# Patient Record
Sex: Female | Born: 1975 | Race: Black or African American | Hispanic: No | Marital: Married | Smoking: Never smoker
Health system: Southern US, Community
[De-identification: ages and names within clinical notes are randomized; demographics above are authoritative.]

---

## 2001-06-07 ENCOUNTER — Emergency Department (HOSPITAL_COMMUNITY): Admission: EM | Admit: 2001-06-07 | Discharge: 2001-06-07 | Payer: Self-pay | Admitting: Emergency Medicine

## 2002-10-07 ENCOUNTER — Other Ambulatory Visit: Admission: RE | Admit: 2002-10-07 | Discharge: 2002-10-07 | Payer: Self-pay | Admitting: Obstetrics and Gynecology

## 2003-04-02 ENCOUNTER — Inpatient Hospital Stay (HOSPITAL_COMMUNITY): Admission: AD | Admit: 2003-04-02 | Discharge: 2003-04-04 | Payer: Self-pay | Admitting: Obstetrics and Gynecology

## 2003-05-14 ENCOUNTER — Other Ambulatory Visit: Admission: RE | Admit: 2003-05-14 | Discharge: 2003-05-14 | Payer: Self-pay | Admitting: Obstetrics and Gynecology

## 2004-03-09 ENCOUNTER — Other Ambulatory Visit: Admission: RE | Admit: 2004-03-09 | Discharge: 2004-03-09 | Payer: Self-pay | Admitting: Obstetrics and Gynecology

## 2004-09-09 ENCOUNTER — Inpatient Hospital Stay (HOSPITAL_COMMUNITY): Admission: AD | Admit: 2004-09-09 | Discharge: 2004-09-11 | Payer: Self-pay | Admitting: Obstetrics and Gynecology

## 2004-09-15 ENCOUNTER — Emergency Department (HOSPITAL_COMMUNITY): Admission: EM | Admit: 2004-09-15 | Discharge: 2004-09-15 | Payer: Self-pay | Admitting: Family Medicine

## 2004-10-14 ENCOUNTER — Other Ambulatory Visit: Admission: RE | Admit: 2004-10-14 | Discharge: 2004-10-14 | Payer: Self-pay | Admitting: Obstetrics and Gynecology

## 2004-12-17 ENCOUNTER — Ambulatory Visit (HOSPITAL_COMMUNITY): Admission: RE | Admit: 2004-12-17 | Discharge: 2004-12-17 | Payer: Self-pay | Admitting: Obstetrics and Gynecology

## 2005-06-08 ENCOUNTER — Ambulatory Visit (HOSPITAL_COMMUNITY): Admission: RE | Admit: 2005-06-08 | Discharge: 2005-06-08 | Payer: Self-pay | Admitting: Obstetrics and Gynecology

## 2005-08-30 ENCOUNTER — Emergency Department (HOSPITAL_COMMUNITY): Admission: EM | Admit: 2005-08-30 | Discharge: 2005-08-30 | Payer: Self-pay | Admitting: Family Medicine

## 2007-01-15 ENCOUNTER — Emergency Department (HOSPITAL_COMMUNITY): Admission: EM | Admit: 2007-01-15 | Discharge: 2007-01-15 | Payer: Self-pay | Admitting: Emergency Medicine

## 2007-03-20 ENCOUNTER — Ambulatory Visit (HOSPITAL_COMMUNITY): Admission: RE | Admit: 2007-03-20 | Discharge: 2007-03-20 | Payer: Self-pay | Admitting: General Surgery

## 2007-03-20 ENCOUNTER — Encounter (INDEPENDENT_AMBULATORY_CARE_PROVIDER_SITE_OTHER): Payer: Self-pay | Admitting: General Surgery

## 2008-07-13 ENCOUNTER — Emergency Department (HOSPITAL_COMMUNITY): Admission: EM | Admit: 2008-07-13 | Discharge: 2008-07-13 | Payer: Self-pay | Admitting: Family Medicine

## 2008-11-23 IMAGING — RF DG CHOLANGIOGRAM OPERATIVE
1 series · 4 of 4 positions shown · non-contrast
Comparison: none

CLINICAL DATA: Gallstones.  
 INTRAOPERATIVE CHOLANGIOGRAM:
TECHNIQUE: Multiple fluoroscopic spot radiographs were obtained during intraoperative cholangiogram, and are submitted for interpretation post-operatively.

[Series 1: run · 4 of 67 frames shown]
[frame 5/67]
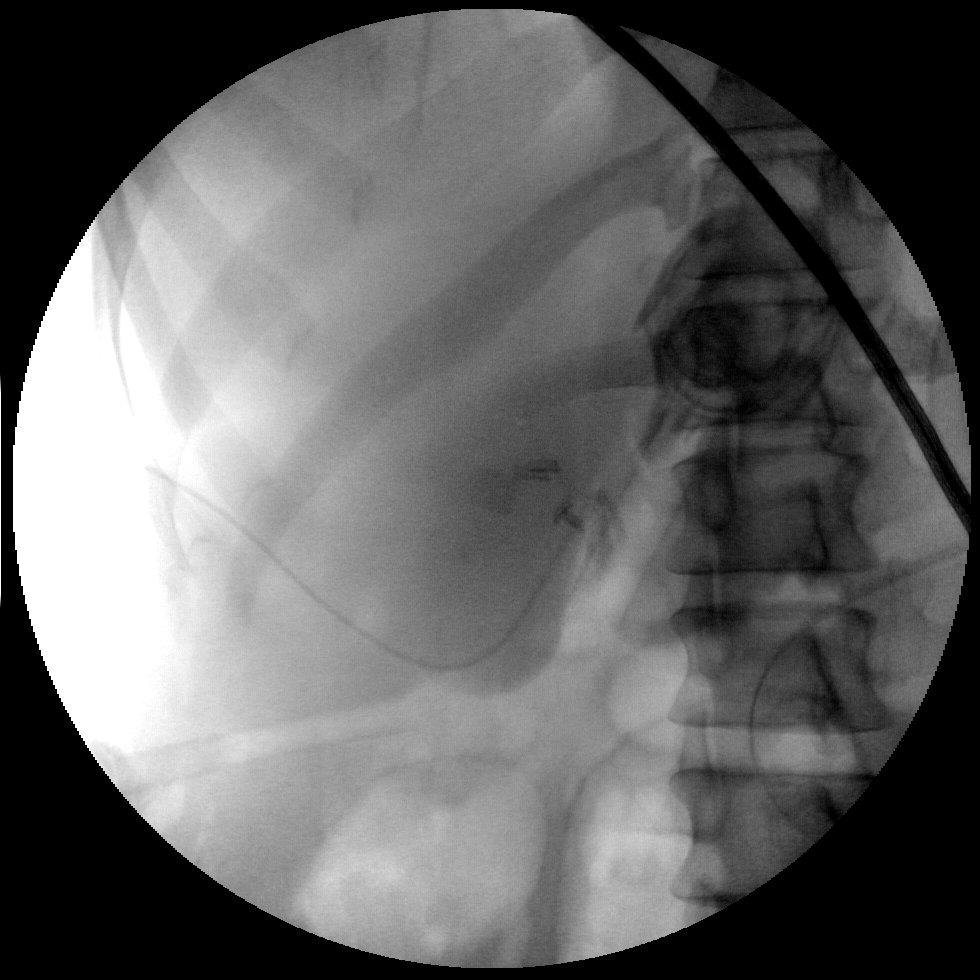
[frame 11/67]
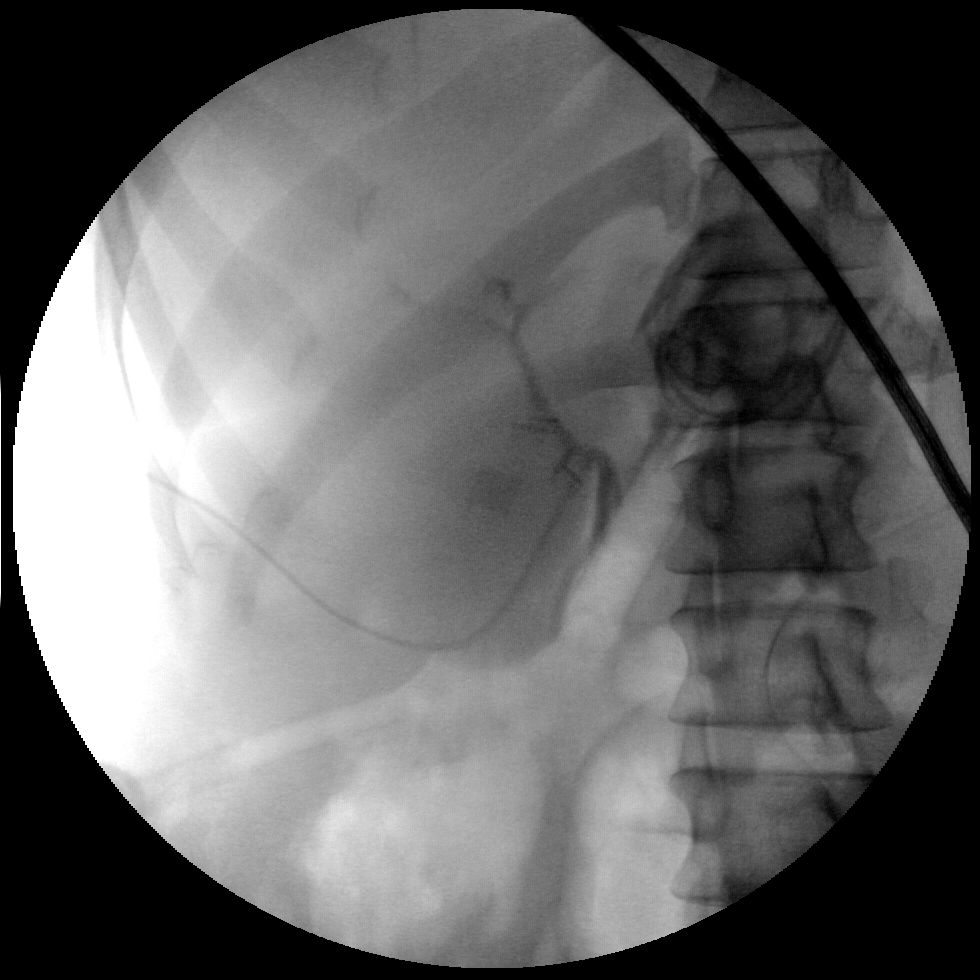
[frame 34/67]
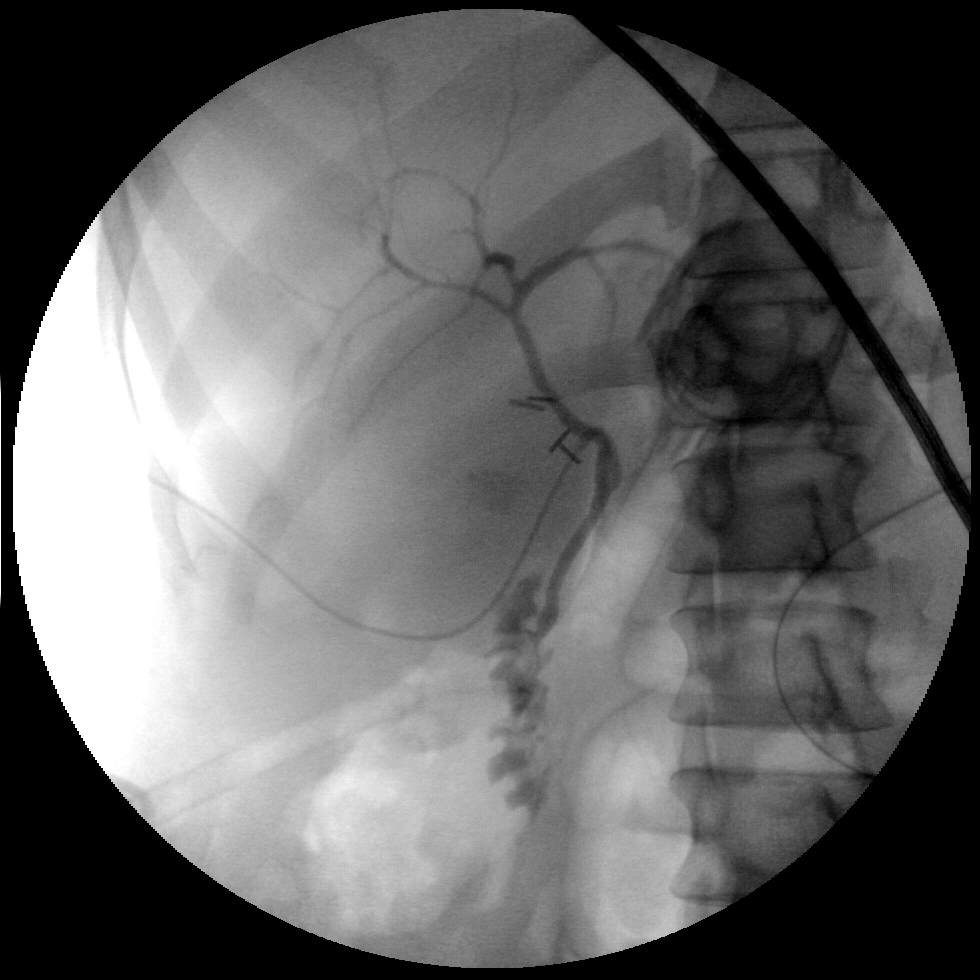
[frame 57/67]
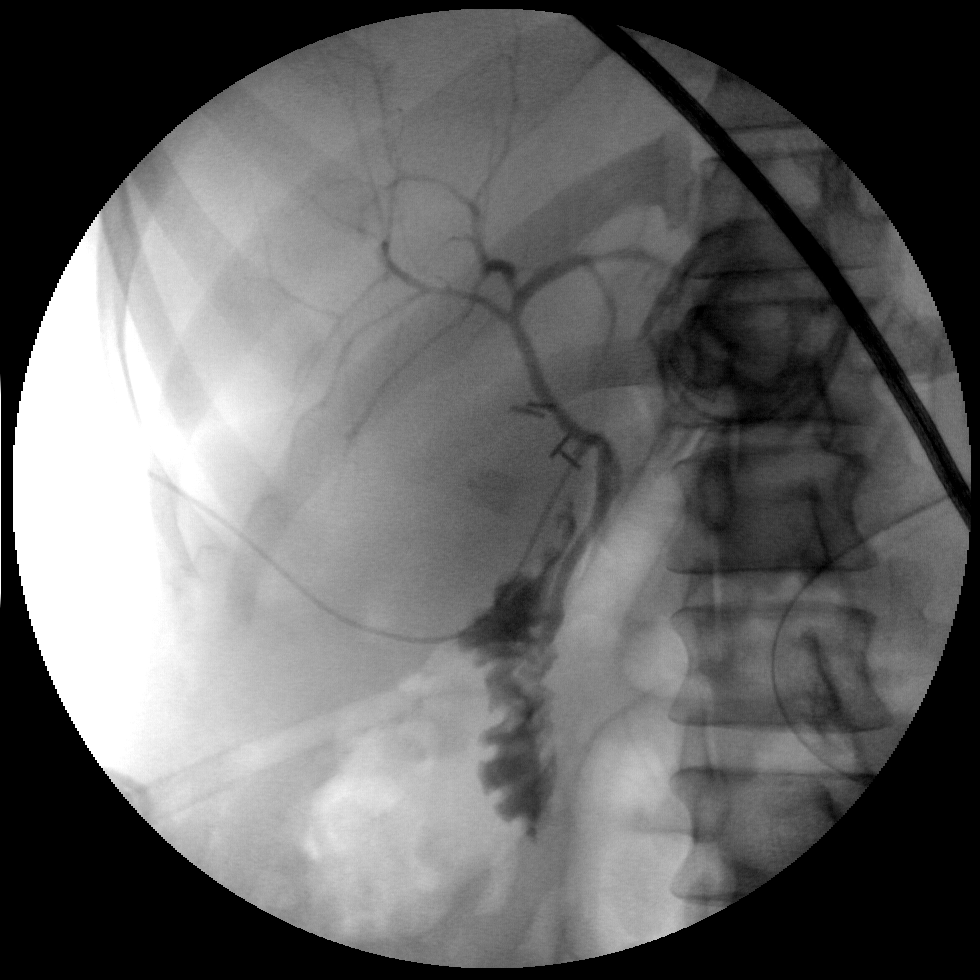

[4 of 4 positions shown; findings below may reference images not displayed]

FINDINGS: Contrast was injected via the cystic duct.  There was good visualization of the common bile duct which is normal in caliber.  No retained calculus is seen, and there is free passage of contrast into the duodenal loop.
IMPRESSION: Negative intraoperative cholangiogram.

## 2010-06-22 LAB — URINE CULTURE

## 2010-06-22 LAB — POCT URINALYSIS DIP (DEVICE)
Nitrite: NEGATIVE
Protein, ur: 30 mg/dL — AB
Specific Gravity, Urine: >=1.03 (ref 1.005–1.030)
Urobilinogen, UA: 0.2 mg/dL (ref 0.0–1.0)
pH: 5.5 (ref 5.0–8.0)

## 2010-07-27 NOTE — Op Note (Signed)
NAMEROLINDA, IMPSON              ACCOUNT NO.:  [id number]   MEDICAL RECORD NO.:  [id number]          PATIENT TYPE:  AMB   LOCATION:  DAY                          FACILITY:  [facility]   PHYSICIAN:  Anselm Pancoast. Weatherly, M.D.DATE OF BIRTH:  [birth year]   DATE OF PROCEDURE:  03/20/2007  DATE OF DISCHARGE:                               OPERATIVE REPORT   PREOPERATIVE DIAGNOSIS:  Chronic cholecystitis with stones.   POSTOPERATIVE DIAGNOSIS:  Chronic cholecystitis with stones.   OPERATION:  Laparoscopic cholecystectomy with cholangiogram.   ANESTHESIA:  General anesthesia.   SURGEON:  Anselm Pancoast. Zachery Dakins, M.D.   ASSISTANT:  Sandria Bales. Ezzard Standing, M.D.   HISTORY:  Ms. Kindig is a 35 year old black female who has a history of  multiple gallstones.  She has three children.  I saw her in the office  in late November.  She had had recurrent episodes of epigastric pain and  normal liver function studies and it appears that she been having stones  for about a year and was tentatively scheduled previously for surgery,  but with three children things got postponed and she started to have  recurrent episodes of pain and I saw her in late November.  She was  scheduled for surgery at this time.  She said she has not actually had  bad episodes of pain over the last several weeks.   PROCEDURE:  Preoperatively her CBC and CMET were all normal.  Preop, she  was given 3 grams of Unasyn, she has PAS stockings and was taken to the  operative suite.   After induction of general anesthesia, endotracheal tube, __________  to  the stomach and then the abdomen was prepped with Betadine solution and  draped in sterile manner.  A small incision was made below the  umbilicus, very carefully entered into the peritoneal cavity.  Pursestring suture of #0 Vicryl was placed and the Hasson cannula  introduced.  The gallbladder was contracted, you could see numerous  stones within it through the thin gallbladder wall and  the upper 10 mL  trocar was placed in the  subxiphoid area and two lateral 5-mm trocars  were placed.  The gallbladder was retracted upward and outward by Dr.  Ezzard Standing and we very carefully opened the peritoneum over the proximal  portion of the gallbladder.  She has a short cystic duct and you could  see a stone impacted in the cystic duct just below its junction with the  gallbladder.  I was able to encompass this with a right-angle carefully  and then tried to push the stone back up into the gallbladder body  proper, but it really was fairly adherent and  could not do this.  The  little anterior branch of the cystic artery had been frayed, doubly  clipped proximally, singly distally, and divided and then we made a  little opening, slipped in a Cholangiocath and did an x-ray that showed  that we had about a centimeter cystic duct, good flow into the common  bile duct and duodenum and the intrahepatic radicals filled nicely.  The  cystic duct was triply  clipped, after removing the catheter, and I  divided the cystic duct just distal to the clips and then very carefully  dissected out and we were probably dissecting where the branches of the  cystic artery on the proximal portion of gallbladder.  These were  carefully separated from the gallbladder wall.  We could see the common  hepatic duct very closely adherent in this area, but under direct  vision, separated and  clipped the little branches of the cystic artery  and then freed the gallbladder.  The gallbladder bed was lightly  coagulated in any areas was of bleeding.  Gallbladder placed into an  EndoCatch bag and brought out through the umbilicus.  She had numerous  stones, some quite small, some a little larger and they were placed in  the little container and sent to pathology.  The gallbladder fossa and  bed were carefully inspected,  no evidence of bleeding.  The clips were  all in good position and then the little irrigating fluid  was aspirated.  The 5-mm ports were withdrawn under direct vision, I put an additional  figure-of-eight of #0 Vicryl in the fascia at the umbilicus, tied both  in the pursestring.  The upper 10-mm trocar was drawn, the subcutaneous  sutures of 4-0 Vicryl were placed, Benzoin and Steri-Strips on the skin.  The patient tolerated procedure nicely and was she was sent to the  recovery room in stable postoperative condition.   I think that she can be released today if she desires, otherwise will  keep her overnight and release her in the morning.           ______________________________  Anselm Pancoast. Zachery Dakins, M.D.     WJW/MEDQ  D:  03/20/2007  T:  03/20/2007  Job:  [id number]   cc:   Gabriel Earing, M.D.  Fax: [phone]

## 2010-07-30 NOTE — H&P (Signed)
Teresa Webster, EDGAR              ACCOUNT NO.:  [id number]   MEDICAL RECORD NO.:  [id number]           PATIENT TYPE:   LOCATION:                                 FACILITY:   PHYSICIAN:  Juluis Mire, M.D.        DATE OF BIRTH:   DATE OF ADMISSION:  12/17/2004  DATE OF DISCHARGE:                                HISTORY & PHYSICAL   HISTORY OF PRESENT ILLNESS:  The patient is a 35 year old, gravida 4, para  3, abortus 1 female who presents for Essure tubal ligation.  The patient is  desirous of permanent sterilization.  The potential irreversibility of  sterilization was discussed.  A failure rate of 1 in 200 is explained.  Failures can be in the form of ectopic pregnancy requiring further surgical  management.  Alternatives for birth control have also been explained.  She  does present for Essure tubal ligation.   ALLERGIES:  No known drug allergies.   MEDICATIONS:  Micronor.   PAST MEDICAL HISTORY:  Usual childhood diseases without any significant  sequelae.   PAST SURGICAL HISTORY:  1.  She had a wisdom tooth extracted.  2.  A D&C for an abortion.   OBSTETRICAL HISTORY:  Besides the previous-noted abortion, she has had 3  vaginal deliveries.   FAMILY HISTORY:  Noncontributory.   SOCIAL HISTORY:  No tobacco or alcohol use.   REVIEW OF SYSTEMS:  Noncontributory.   PHYSICAL EXAMINATION:  GENERAL:  The patient is afebrile.  VITAL SIGNS:  Stable.  HEENT:  The patient is normocephalic.  Pupils equal, round and reactive to  light and accommodation.  Extraocular movements were intact.  Sclerae and  conjunctivae were clear.  Oropharynx clear.  NECK:  Without thyromegaly.  BREASTS:  No discrete masses.  LUNGS:  Clear.  CARDIAC:  Regular rhythm and rate.  No murmurs or gallops.  ABDOMEN:  Benign.  No mass, organomegaly, or tenderness.  PELVIC:  Normal external genitalia.  Vaginal mucosa clear.  Cervix  unremarkable.  Uterus normal size, shape, and contour.  Adnexa free of  masses or tenderness.  EXTREMITIES:  Trace edema.  NEUROLOGIC:  Grossly within normal limits.   BASIC IMPRESSION:  Multiparity, desires sterility.   PLAN:  The patient will under a hysteroscopic Essure tubal ligation.  We  have discussed again the potential irreversibility, as well as failure  rates.  Alternatives for birth control explained.  Discussed the risk of the  procedure including anesthetic concerns.  The risk of inciting infection.  Of note, GC and Chlamydia during the pregnancy were negative.  We have  discussed the risk of perforation of tube or uterus with associated bleeding  that could require transfusion, possible exploratory surgery for management.  The risk of injury to adjacent organs requiring exploratory surgery.  The  risk of deep vein thrombosis and pulmonary embolus.  If unable to do the  Essure properly, will proceed with laparoscopic bilateral tubal ligation.  Its risks have also been explained, as noted above.      Juluis Mire, M.D.  Electronically Signed  JSM/MEDQ  D:  12/17/2004  T:  12/17/2004  Job:  [id number]

## 2010-07-30 NOTE — Op Note (Signed)
NAMEFELIZA, Teresa Webster              ACCOUNT NO.:  [id number]   MEDICAL RECORD NO.:  [id number]          PATIENT TYPE:  AMB   LOCATION:  SDC                           FACILITY:  [facility]   PHYSICIAN:  Juluis Mire, M.D.   DATE OF BIRTH:  Feb 20, 1976   DATE OF PROCEDURE:  12/17/2004  DATE OF DISCHARGE:                                 OPERATIVE REPORT   PREOPERATIVE DIAGNOSIS:  Multiparity, desires sterility.   POSTOPERATIVE DIAGNOSIS:  Multiparity, desires sterility.   OPERATIVE PROCEDURE:  Hysteroscopy and placement of the Essure.   SURGEON:  Juluis Mire, M.D.   ANESTHESIA:  General.   ESTIMATED BLOOD LOSS:  Minimal.   PACKS AND DRAINS:  None.   INTRAOPERATIVE BLOOD REPLACED:  None.   COMPLICATIONS:  None.   INDICATIONS:  Dictated in the history and physical.   PROCEDURE:  The patient was taken to the OR and placed in the supine  position.  After a satisfactory level of general anesthesia was obtained,  the patient was placed in the dorsal lithotomy position using the Allen  stirrups.  The abdomen, perineum and vagina were prepped out with Betadine  and draped in a sterile field.  A speculum was placed in the vaginal vault.  The cervix was grasped with a single-tooth tenaculum.  The uterus sounded  approximately 8 cm.  The cervix was dilated to a size 25 Pratt dilator.  The  diagnostic hysteroscope was introduced and the intrauterine cavity was  distended using sorbitol.  Visualization revealed a normal endometrial  cavity.  We could visualize both tubal ostia without difficulty.  We first  went to the right fallopian tube.  The Essure was brought into place and  properly deployed.  After removal, we had approximately four coils exposed  in the intrauterine cavity on the right side.  We then went to the left  side, again identified the tubal ostium.  We ensured the Essure and deployed  it properly.  There were approximately four coils also on the left side.  At  the end of  the procedure, there  was no sign of perforation.  Total deficit was 300 mL.  The speculum and  single-tooth tenaculum were removed.  The patient was taken out of the  dorsal lithotomy position, once alert and extubated transferred to the  recovery room in good condition.  Sponge, instrument and needle count  reported as correct by the circulating nurse x2.      Juluis Mire, M.D.  Electronically Signed     JSM/MEDQ  D:  12/17/2004  T:  12/17/2004  Job:  [id number]

## 2010-12-01 LAB — CBC
Hemoglobin: 12.3
MCHC: 34.2
Platelets: 437 — ABNORMAL HIGH
RDW: 12.8
WBC: 7.5

## 2010-12-01 LAB — COMPREHENSIVE METABOLIC PANEL
Albumin: 3.9
BUN: 18
CO2: 28
Chloride: 110
GFR calc non Af Amer: >60
Potassium: 4.3
Sodium: 141
Total Bilirubin: 0.5
Total Protein: 7

## 2010-12-01 LAB — DIFFERENTIAL
Eosinophils Absolute: 0.2
Lymphocytes Relative: 46
Lymphs Abs: 3.5
Neutro Abs: 3.2
Neutrophils Relative %: 43

## 2016-04-09 DIAGNOSIS — M940 Chondrocostal junction syndrome [Tietze]: Secondary | ICD-10-CM | POA: Diagnosis not present

## 2016-04-14 DIAGNOSIS — Z713 Dietary counseling and surveillance: Secondary | ICD-10-CM | POA: Diagnosis not present

## 2016-04-19 DIAGNOSIS — Z01419 Encounter for gynecological examination (general) (routine) without abnormal findings: Secondary | ICD-10-CM | POA: Diagnosis not present

## 2017-01-04 DIAGNOSIS — D473 Essential (hemorrhagic) thrombocythemia: Secondary | ICD-10-CM | POA: Diagnosis not present

## 2017-01-04 DIAGNOSIS — Z23 Encounter for immunization: Secondary | ICD-10-CM | POA: Diagnosis not present

## 2017-02-23 ENCOUNTER — Ambulatory Visit (HOSPITAL_COMMUNITY)
Admission: EM | Admit: 2017-02-23 | Discharge: 2017-02-23 | Disposition: A | Discharge status: Home / Self Care | Payer: 59 | Attending: Internal Medicine | Admitting: Internal Medicine

## 2017-02-23 ENCOUNTER — Other Ambulatory Visit: Payer: Self-pay

## 2017-02-23 ENCOUNTER — Encounter (HOSPITAL_COMMUNITY): Payer: Self-pay | Admitting: Emergency Medicine

## 2017-02-23 DIAGNOSIS — M79671 Pain in right foot: Secondary | ICD-10-CM

## 2017-02-23 NOTE — Discharge Instructions (Signed)
Pain may be related to an over use injury/ muscular strain of the foot.   Please use anti-inflammatories for pain relief- may use ibuprofen or aleeve, may supplement with Tylenol. May also use ice multiple times a day.   Please rest and weight bear as tolerated. Avoid activity for 1 week and monitor improvement of symptoms. If symptoms are not improving in 1-2 weeks please return.

## 2017-02-23 NOTE — ED Triage Notes (Signed)
Drives fork lifts and motorized equipment.  Used a different piece of equipment requiring pressing a peddle with the right foot, constantly.  Near end of shift started having pain in right foot.  no particular injury.  Pain in arch above and below.  Pain is constant, worse with bearing weight

## 2017-02-23 NOTE — ED Provider Notes (Signed)
[place]    CSN: [id number] Arrival date & time: 02/23/17  1002     History   Chief Complaint Chief Complaint  Patient presents with  . Foot Pain    HPI Teresa Webster is a 41 y.o. female presenting with right foot pain for 1 day. 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 She is standing most of the shift on the forklift. She noticed towards the end of the shift she started to have a throbbing pain in her right foot. No injury, twisting, or dropping anything on foot. Wears work boots that are not new. States increased pain with weightbearing, pain is not relieving with rest/non-weightbearing. Pain does not radiate into calf or knee   HPI  History reviewed. No pertinent past medical history.  There are no active problems to display for this patient.   History reviewed. No pertinent surgical history.  OB History    No data available       Home Medications    Prior to Admission medications   Not on File    Family History Family History  Problem Relation Age of Onset  . Healthy Mother     Social History Social History   Tobacco Use  . Smoking status: Never Smoker  Substance Use Topics  . Alcohol use: Yes  . Drug use: No     Allergies   Patient has no known allergies.   Review of Systems Review of Systems  Constitutional: Negative for fatigue and fever.  Respiratory: Negative for shortness of breath.   Cardiovascular: Negative for chest pain.  Gastrointestinal: Negative for nausea and vomiting.  Musculoskeletal: Negative for gait problem and joint swelling.       Foot pain  Skin: Negative for color change, pallor and wound.  Neurological: Negative for weakness and numbness.     Physical Exam Triage Vital Signs ED Triage Vitals  Enc Vitals Group     BP 02/23/17 1019 (!) 147/89     Pulse Rate 02/23/17 1019 (!) 104     Resp 02/23/17 1019 18   Temp 02/23/17 1019 98.4 F (36.9 C)     Temp Source 02/23/17 1019 Oral     SpO2 02/23/17 1019 98 %     Weight --      Height --      Head Circumference --      Peak Flow --      Pain Score 02/23/17 1015 7     Pain Loc --      Pain Edu? --      Excl. in [place]? --    No data found.  Updated Vital Signs BP (!) 147/89 (BP Location: Left Arm)   Pulse (!) 104   Temp 98.4 F (36.9 C) (Oral)   Resp 18   LMP 02/23/2017   SpO2 98%   Physical Exam  Constitutional: She appears well-developed and well-nourished.  HENT:  Head: Normocephalic and atraumatic.  Eyes: EOM are normal.  Neck: Normal range of motion. Neck supple.  Cardiovascular: Normal rate and regular rhythm.  Pulmonary/Chest: Effort normal. No respiratory distress.  Abdominal: Soft. There is no tenderness.  Musculoskeletal:  Right foot: no swelling or erythema, mild tenderness to palpation of tarsals, no tenderness to palpation of lateral/medial malleolus, no tenderness over metatarsals/phalanges. Able to move toes without pain. Full active ROM of plantar flexion and dorsiflexion, pain with dorsiflexion. Dorsalis  Pedis 2+, Cap refill < 2 sec     UC Treatments / Results  Labs (all labs ordered are listed, but only abnormal results are displayed) Labs Reviewed - No data to display  EKG  EKG Interpretation None       Radiology No results found.  Procedures Procedures (including critical care time)  Medications Ordered in UC Medications - No data to display   Initial Impression / Assessment and Plan / UC Course  I have reviewed the triage vital signs and the nursing notes.  Pertinent labs & imaging results that were available during my care of the patient were reviewed by me and considered in my medical decision making (see chart for details).    Pain may be related to a muscular strain vs. Over use injury vs. Tendonitis. Unlikely a fracture/dislocation- Xray deferred. Treated with anti-inflammatories, rest,  ice, compression, elevation. Advised weight bear as tolerated, symptoms should improve in 1-2 weeks with rest and activity modification. 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 Advised she may try to use inserts to help with support in her work boots. Discussed return precautions to include pain not improving over 1-2 weeks, increased pain, weakness, numbness/tingling. Patient verbalized understanding and is agreeable with plan.   Final Clinical Impressions(s) / UC Diagnoses   Final diagnoses:  Foot pain, right    ED Discharge Orders    None       Controlled Substance Prescriptions [place] Controlled Substance Registry consulted? Not Applicable   Janith Lima, [place] 02/23/17 1335

## 2018-01-09 ENCOUNTER — Telehealth: Payer: Self-pay | Admitting: Hematology and Oncology

## 2018-01-09 ENCOUNTER — Encounter: Payer: Self-pay | Admitting: Hematology and Oncology

## 2018-01-09 NOTE — Telephone Encounter (Signed)
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 Pt returned my call to schedule an appt. Pt has been scheduled to see Dr. Audelia Hives on 11/22 at 1pm. Pt aware to arrive 30 minutes early. Letter mailed.

## 2018-02-02 ENCOUNTER — Other Ambulatory Visit: Payer: Self-pay | Admitting: Hematology and Oncology

## 2018-02-02 ENCOUNTER — Inpatient Hospital Stay: Payer: No Typology Code available for payment source

## 2018-02-02 ENCOUNTER — Telehealth: Payer: Self-pay

## 2018-02-02 ENCOUNTER — Inpatient Hospital Stay
Payer: No Typology Code available for payment source | Attending: Hematology and Oncology | Admitting: Hematology and Oncology

## 2018-02-02 VITALS — BP 151/104 | HR 96 | Temp 98.7°F | Resp 18 | Ht 62.0 in | Wt 201.3 lb

## 2018-02-02 DIAGNOSIS — R5383 Other fatigue: Secondary | ICD-10-CM | POA: Insufficient documentation

## 2018-02-02 DIAGNOSIS — D473 Essential (hemorrhagic) thrombocythemia: Secondary | ICD-10-CM

## 2018-02-02 DIAGNOSIS — M1990 Unspecified osteoarthritis, unspecified site: Secondary | ICD-10-CM | POA: Insufficient documentation

## 2018-02-02 DIAGNOSIS — D75839 Thrombocytosis, unspecified: Secondary | ICD-10-CM

## 2018-02-02 DIAGNOSIS — E559 Vitamin D deficiency, unspecified: Secondary | ICD-10-CM | POA: Diagnosis not present

## 2018-02-02 DIAGNOSIS — R7989 Other specified abnormal findings of blood chemistry: Secondary | ICD-10-CM

## 2018-02-02 DIAGNOSIS — E785 Hyperlipidemia, unspecified: Secondary | ICD-10-CM

## 2018-02-02 NOTE — Patient Instructions (Signed)
We discussed in detail the results of your prior laboratory studies which suggest normal white blood and red blood cells.  Your platelet count is only mildly elevated.  Your platelets have been very mildly elevated for many years.  Copy of your blood work was given for your review.  Laboratory studies will be done today to distinguish between a primary bone marrow overproduction versus a reactive process.  You need to be very careful about donating blood while your Ste[place] in menstruating.  This will be discussed in more detail at the time of your next visit.  Barring any unforeseen complications, your next scheduled doctor visit to discuss these results is on February 23, 2018.  Please do not hesitate to call should any new or untoward problems arise in the interim.  Thank you! Happy Thanksgiving Teresa Ridgel, MD Hematology/Oncology

## 2018-02-02 NOTE — Telephone Encounter (Signed)
Printed avs and calender of upcoming appointment. Per 11/22 los

## 2018-02-02 NOTE — Progress Notes (Signed)
Outpatient Hematology/Oncology Initial Consultation  Patient Name:  Teresa Webster  DOB: [birth year]   Date of Service: February 02, 2018  Referring Provider: Maude Leriche, PA-C [place], [place] [address]   Consulting Physician: Teresa Leber, MD Hematology/Oncology she is alone at this first visit.  Reason for Referral: In the setting of mild thrombocytosis without obvious explanation, she presents now for further diagnostic and therapeutic recommendations.  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  She is a gravida 5 para 3 abortion 2.  Her menses are generally every 28 days and fairly light.  Her primary care provider is Teresa Webster.  She is followed also by Teresa Webster, physician assistant.  Over the past 12 months, she has gained 45 pounds.  She tried to lose weight by increasing her water intake and increasing fruits and vegetables.  She complains of daytime fatigue although she sleeps 6-7 hours nightly without difficulty.  She is alone at this first visit.  She was aware that her platelet count was elevated for nearly the past 12 months.  A review of her distant laboratory studies from March 20, 2007 a complete blood count with hemoglobin 12.3 hematocrit 35.9 WBC 7.5; platelets 437,000.  More recently, on December 30, 2017: A repeat complete blood count showed hemoglobin 12.0 hematocrit 36.6 MCV 83 MCH 27.2 RDW 13.8 WBC 6.9 with 48% neutrophils 42% lymphocytes 8% monocytes 2% eosinophils; platelets 512,000.  TSH 1.67 vitamin D 11.6.  A comprehensive metabolic panel shows sodium 139 potassium 4.2 chloride 102 CO2 21 glucose 80 BUN 10 creatinine 0.7 calcium 9.8 total protein 7.3 albumin 4.3 total bilirubin 0.3 alkaline phosphatase 126 SGOT 14 SGPT  12.  She has no coronary artery disease, angina, or myocardial infarction.  She denies diabetes mellitus, or cardiac dysrhythmia.  Her blood pressure has been consistently elevated without a diagnosis of primary hypertension.  She has no stroke syndrome or seizure disorder.  She denies gastroesophageal reflux or peptic ulcer disease.  She has no viral hepatitis, inflammatory bowel disease, or symptomatic diverticulosis.  She has no kidney or renal failure.  She has no rheumatoid or gouty arthritis.  She has degenerative joint disease involving the fingers described as intermittent stiffness and pain.  She has no prior blood disorder or bleeding tendency.  In her family there is no blood disorder.  She reports no peripheral arterial venous thromboembolic disease.  While her appetite remains stable, she is gained 45 pounds in the past 12 months. She has no unusual headaches, dizziness, lightheadedness, syncope, or near syncopal episodes. She reports no visual changes or hearing deficit.  She has no rash or itching.  There is no unusual cough, sore throat, orthopnea. She has no dyspnea either at rest or on exertion.  No fever, shaking chills, sweats, or flulike symptoms are evident. She has no heartburn or indigestion.  She denies nausea, vomiting, diarrhea, or constipation. She has no melena or bright red blood per rectum.  No urinary frequency, urgency, hematuria, dysuria are evident. She has no new arthralgias or myalgias.  She denies bleeding tendency or easy bruisability.  She denies numbness or tingling in the fingers or toes.  It is with this background she presents now for further diagnostic and therapeutic recommendations in the setting of thrombocytosis as outlined above.  Past Medical History: Dyslipidemia Vitamin  D deficiency Degenerative joint disease involving the fingers bilaterally  Surgical History: 2018, uterine ablation Cholecystectomy 2010 Tubal ligation 2008  Gynecologic  History: Her menarche was at age 71 years Her last menses was 1 week earlier She is a gravida 5 para 3 abortion 2 Her first pregnancy was at age 38 years Her last pregnancy was at age 78 years He is generally light lasting 3-5 days She was on oral contraception for 8 years starting in her teens Her last screening mammogram was in 2019.  Her last Pap smear was reportedly normal in 2019 She has never had a breast biopsy  Family History  Problem Relation Age of Onset  . Healthy Mother   Mother: Age 110 years: No medical problems Father: Age 80 years: No medical problems She has no brothers She has 1 sister without medical problems at age 62 years  Social History   Socioeconomic History  . Marital status: Married    Spouse name: Not on file  . Number of children: Not on file  . Years of education: Not on file  . Highest education level: Not on file  Occupational History  . Not on file  Social Needs  . Financial resource strain: Not on file  . Food insecurity:    Worry: Not on file    Inability: Not on file  . Transportation needs:    Medical: Not on file    Non-medical: Not on file  Tobacco Use  . Smoking status: Never Smoker  Substance and Sexual Activity  . Alcohol use: Yes  . Drug use: No  . Sexual activity: Not on file  Lifestyle  . Physical activity:    Days per week: Not on file    Minutes per session: Not on file  . Stress: Not on file  Relationships  . Social connections:    Talks on phone: Not on file    Gets together: Not on file    Attends religious service: Not on file    Active member of club or organization: Not on file    Attends meetings of clubs or organizations: Not on file    Relationship status: Not on file  . Intimate partner violence:    Fear of current or ex partner: Not on file    Emotionally abused: Not on file    Physically abused: Not on file    Forced sexual activity: Not on file  Other Topics Concern  . Not on file  Social History  Narrative  . 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 She has 1 child who is autistic.  Transfusion History: No prior transfusion  Exposure History: She has no known exposure to toxic chemicals, radiation, or pesticides  Allergies:  She has no known medical allergies No food or seasonal allergies  Current Medications: No current outpatient medications on file prior to visit.   No current facility-administered medications on file prior to visit.     Review of Systems: Constitutional: No fever, sweats, or shaking chills. No appetite deficit; 45 pound weight gain over the past 12 months.  Easy fatigability; daytime tiredness. Skin: No rash, scaling, sores, lumps, or jaundice. HEENT: No visual changes or hearing deficit. Pulmonary: No unusual cough, sore throat, or orthopnea. Breasts: No complaints. Cardiovascular: No coronary artery disease, angina, or myocardial infarction.  No cardiac dysrhythmia; probable hypertension; dyslipidemia. Gastrointestinal: No indigestion, dysphagia, abdominal pain, diarrhea, or  constipation.  No change in bowel habits. Genitourinary: No urinary frequency, urgency, hematuria, or dysuria. Musculoskeletal: No new arthralgias or myalgias; no joint swelling, pain, or instability; stiffness of the fingers bilaterally. Hematologic: No bleeding tendency or easy bruisability. Endocrine: No intolerance to hot or cold; no thyroid disease or diabetes mellitus; vitamin D deficiency. Vascular: No peripheral arterial or venous thromboembolic disease. Psychological: No anxiety, depression, or mood changes; no mental health illnesses. Neurological: No dizziness, lightheadedness, syncope, or near syncopal episodes; no numbness or tingling in the fingers or toes.  Physical Examination: Vital Signs: Body surface area is 2 meters squared.  Vitals:   02/02/18 1325  BP: (!) 151/104  Pulse: 96  Resp:  18  Temp: 98.7 F (37.1 C)  SpO2: 100%    Filed Weights   02/02/18 1325  Weight: 201 lb 4.8 oz (91.3 kg)   ECOG PERFORMANCE STATUS: 1 Constitutional:  Teresa Webster is a fully nourished and developed African-American albeit overweight. She looks age appropriate.  She is friendly and cooperative without respiratory compromise at rest. Skin: No rashes, scaling, dryness, jaundice, or itching. HEENT: Head is normocephalic and atraumatic.  Pupils are equal round and reactive to light and accommodation.  Sclerae are anicteric.  Conjunctivae are pink.  No sinus tenderness nor oropharyngeal lesions.  Lips without cracking or peeling; tongue without mass, inflammation, or nodularity.  Mucous membranes are moist. Neck: Supple and symmetric.  No jugular venous distention or thyromegaly.  Trachea is midline. Lymphatics: No cervical or supraclavicular lymphadenopathy.  No epitrochlear, axillary, or inguinal lymphadenopathy is appreciated. Respiratory/chest: Thorax is symmetrical.  Breath sounds are clear to auscultation and percussion.  Normal excursion and respiratory effort. Back: Symmetric without deformity or tenderness. Cardiovascular: Heart rate and rhythm are regular without murmurs, gallops, or rubs. Gastrointestinal: Abdomen is soft, nontender; no organomegaly.  Bowel sounds are normoactive.  No masses are appreciated. Extremities: In the lower extremities, there is no asymmetric swelling, erythema, tenderness, or cord formation.  No clubbing, cyanosis, nor edema. Hematologic: No petechiae, hematomas, or ecchymoses. Psychological: She is oriented to person, place, and time; normal affect.  The Neurological: There are no gross neurologic deficits.  Laboratory Results: I have reviewed the data as listed: Erythropoietin 18.8 CBC and differential: Pending CBC 03/20/2007  WBC 7.5  Hemoglobin 12.3  Hematocrit 35.9(L)  Platelets 437(H)    CMP 03/20/2007  Glucose 93  BUN 18  Creatinine 0.75   Sodium 141  Potassium 4.3  Chloride 110  CO2 28  Calcium 9.7  Total Protein 7.0  Total Bilirubin 0.5  Alkaline Phos 64  AST 17  ALT 16   Summary/Assessment: In the setting of mild thrombocytosis without obvious explanation, she presents now for further diagnostic and therapeutic recommendations.  A review of her distant laboratory studies from March 20, 2007 a complete blood count with hemoglobin 12.3 hematocrit 35.9 WBC 7.5; platelets 437,000.  More recently, on December 30, 2017: A repeat complete blood count showed hemoglobin 12.0 hematocrit 36.6 MCV 83 MCH 27.2 RDW 13.8 WBC 6.9 with 48% neutrophils 42% lymphocytes 8% monocytes 2% eosinophils; platelets 512,000.  TSH 1.67 vitamin D 11.6.  A comprehensive metabolic panel shows sodium 139 potassium 4.2 chloride 102 CO2 21 glucose 80 BUN 10 creatinine 0.7 calcium 9.8 total protein 7.3 albumin 4.3 total bilirubin 0.3 alkaline phosphatase 126 SGOT 14 SGPT 12.  She has no coronary artery disease, angina, or myocardial infarction.  She denies diabetes mellitus, or cardiac dysrhythmia.  Her blood pressure has been consistently elevated without  a diagnosis of primary hypertension.  She has no stroke syndrome or seizure disorder.  She denies gastroesophageal reflux or peptic ulcer disease.  She has no viral hepatitis, inflammatory bowel disease, or symptomatic diverticulosis.  She has no kidney or renal failure.  She has no rheumatoid or gouty arthritis.  She has degenerative joint disease involving the fingers described as intermittent stiffness and pain.  She has no prior blood disorder or bleeding tendency.  In her family there is no blood disorder.  She reports no peripheral arterial or venous thromboembolic disease.  While her appetite remains stable, she is gained 45 pounds in the past 12 months. She has no unusual headaches, dizziness, lightheadedness, syncope, or near syncopal episodes. She reports no visual changes or hearing deficit.  She has  no rash or itching.  There is no unusual cough, sore throat, orthopnea. She has no dyspnea either at rest or on exertion.  No fever, shaking chills, sweats, or flulike symptoms are evident. She has no heartburn or indigestion.  She denies nausea, vomiting, diarrhea, or constipation. She has no melena or bright red blood per rectum.  No urinary frequency, urgency, hematuria, dysuria are evident. She has no new arthralgias or myalgias.  She denies bleeding tendency or easy bruisability.  She denies numbness or tingling in the fingers or toes.  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  She is a gravida 5 para 3 abortion 2.  Her menses are generally every 28 days and fairly light.  Over the past 12 months, she has gained 45 pounds.  She tried to lose weight by increasing her water intake and increasing fruits and vegetables.  She complains of daytime fatigue although she sleeps 6-7 hours nightly without difficulty. She was aware that her platelet count was elevated for nearly the past 12 months.  Recommendation/Plan: The results of her laboratory studies were not available at the time of discharge.  Because she works for [employer], most of her labs were sent out to them.  Laboratory studies were obtained to exclude an underlying primary myeloproliferative process versus iron deficiency without frank anemia. Those results are pending.  Barring any unforeseen complications, her next scheduled doctor visit with laboratory studies is on February 23, 2018.  She was advised that she will be seeing another provider since I am leaving the practice.  The total time spent discussing the distinction between primary versus secondary thrombocytosis, methodology for evaluating both, and preliminary considerations with recommendations was 60 minutes. At least 50% of that time was spent in discussion, reviewing  outside records, laboratory evaluation, counseling, and answering questions. All questions were answered to her satisfaction.   This note was dictated using voice activated technology/software.  Unfortunately, typographical errors are not uncommon, and transcription is subject to mistakes and regrettably misinterpretation.  If necessary, clarification of the above information can be discussed with me at any time.  Thank you [name] for allowing my participation in the care of Dezeray Puccio. Please do not hesitate to call should any questions arise regarding this initial consultation and discussion.  FOLLOW UP: AS DIRECTED   cc:            Teresa Leriche, PA-C           Teresa Leber, MD  Hematology/Oncology [facility] [address] [place], [place] [address] Office: [phone] VELF: [phone]

## 2018-02-03 DIAGNOSIS — E559 Vitamin D deficiency, unspecified: Secondary | ICD-10-CM | POA: Insufficient documentation

## 2018-02-03 DIAGNOSIS — E785 Hyperlipidemia, unspecified: Secondary | ICD-10-CM | POA: Insufficient documentation

## 2018-02-03 LAB — ERYTHROPOIETIN: Erythropoietin: 18.8 m[IU]/mL — ABNORMAL HIGH (ref 2.6–18.5)

## 2018-03-09 ENCOUNTER — Telehealth: Payer: Self-pay

## 2018-03-09 NOTE — Telephone Encounter (Signed)
Left a detailed message of patient upcoming appointment. Per 12/13 los. Mailed a calender of this appointment.

## 2018-03-30 ENCOUNTER — Inpatient Hospital Stay: Payer: No Typology Code available for payment source | Admitting: Internal Medicine

## 2018-04-06 ENCOUNTER — Inpatient Hospital Stay: Payer: No Typology Code available for payment source | Attending: Hematology and Oncology | Admitting: Internal Medicine

## 2018-04-06 ENCOUNTER — Telehealth: Payer: Self-pay | Admitting: Internal Medicine

## 2018-04-06 ENCOUNTER — Inpatient Hospital Stay: Payer: No Typology Code available for payment source

## 2018-04-06 VITALS — BP 141/94 | HR 109 | Temp 98.5°F | Resp 18 | Ht 62.0 in | Wt 203.2 lb

## 2018-04-06 DIAGNOSIS — R7989 Other specified abnormal findings of blood chemistry: Secondary | ICD-10-CM | POA: Diagnosis present

## 2018-04-06 DIAGNOSIS — E785 Hyperlipidemia, unspecified: Secondary | ICD-10-CM | POA: Insufficient documentation

## 2018-04-06 DIAGNOSIS — E559 Vitamin D deficiency, unspecified: Secondary | ICD-10-CM

## 2018-04-06 DIAGNOSIS — D75839 Thrombocytosis, unspecified: Secondary | ICD-10-CM

## 2018-04-06 DIAGNOSIS — R718 Other abnormality of red blood cells: Secondary | ICD-10-CM | POA: Diagnosis not present

## 2018-04-06 DIAGNOSIS — D473 Essential (hemorrhagic) thrombocythemia: Secondary | ICD-10-CM

## 2018-04-06 NOTE — Telephone Encounter (Signed)
Gave AVS and calendar °

## 2018-04-06 NOTE — Progress Notes (Signed)
Diagnosis Thrombocytosis (HCC) - Plan: CBC with Differential/Platelet, Comprehensive metabolic panel, Lactate dehydrogenase, Ferritin, Iron and TIBC, Transferrin Saturation, BCR-ABL1 FISH, JAK2 V617F, w Reflex to CALR/E12/MPL, Sedimentation rate, C-reactive protein, Rheumatoid factor, Hemoglobinopathy evaluation  Microcytosis - Plan: CBC with Differential/Platelet, Comprehensive metabolic panel, Lactate dehydrogenase, Ferritin, Iron and TIBC, Transferrin Saturation, BCR-ABL1 FISH, JAK2 V617F, w Reflex to CALR/E12/MPL, Sedimentation rate, C-reactive protein, Rheumatoid factor, Hemoglobinopathy evaluation  Staging Cancer Staging No matching staging information was found for the patient.  Assessment and Plan: 1.  Thrombocytosis.  43 year old female previously followed by Dr. Audelia Hives.  PT had  laboratory studies from March 20, 2007 a complete blood count with hemoglobin 12.3 hematocrit 35.9 WBC 7.5; platelets 437,000.  More recently, on December 30, 2017: A repeat complete blood count showed hemoglobin 12.0 hematocrit 36.6 MCV 83 MCH 27.2 RDW 13.8 WBC 6.9 with 48% neutrophils 42% lymphocytes 8% monocytes 2% eosinophils; platelets 512,000.  TSH 1.67 vitamin D 11.6.  A comprehensive metabolic panel shows sodium 139 potassium 4.2 chloride 102 CO2 21 glucose 80 BUN 10 creatinine 0.7 calcium 9.8 total protein 7.3 albumin 4.3 total bilirubin 0.3 alkaline phosphatase 126 SGOT 14 SGPT 12.  Pt had blood work done at [facility] on 02/02/2018 that showed WBC 8.4 HB 11.9 plts 560,000.  She has normal differential.  Chemistries WNL with Cr 0.71 K+ 4 normal LFTs.  Ferritin WNL at 62.   I discussed with pt most recent labs available for review are from [place] done in 01/2018 and showed evidence of thrombocytosis.  I discussed with her she will need to have blood work done at [facility] lab to help evaluate the potential etiologies of her elevated platelet.    She initially was reporting she had several  labs done previously.  I discussed with her along with nursing that lab was contacted and most recent blood ordered by Dr. Audelia Hives was done at [facility] on 02/02/2018.   Prior labs likely done through PCP as reviewed by Dr. Audelia Hives per his initial visit note.    Have ordered CBC, CMP, LDH, iron studies, inflammatory markers, BCR/ABL and JaK2.  Pt will RTC in 2-3 weeks to go over results.   2.  Microcytosis.  MCV on labs done 02/02/2018 was 81.  Recent Ferritin was 62.  Will send Hemoglobin electrophoresis to evaluate for hemoglobinopathy or trait condition.    Interval History: Historical data obtained from note dated 02/02/2018:  43 yr old female previously followed by Dr. Audelia Hives.  Laboratory studies from March 20, 2007 a complete blood count with hemoglobin 12.3 hematocrit 35.9 WBC 7.5; platelets 437,000.  More recently, on December 29, 2017: A repeat complete blood count showed hemoglobin 12.0 hematocrit 36.6 MCV 83 MCH 27.2 RDW 13.8 WBC 6.9 with 48% neutrophils 42% lymphocytes 8% monocytes 2% eosinophils; platelets 512,000.  TSH 1.67 vitamin D 11.6.  A comprehensive metabolic panel shows sodium 139 potassium 4.2 chloride 102 CO2 21 glucose 80 BUN 10 creatinine 0.7 calcium 9.8 total protein 7.3 albumin 4.3 total bilirubin 0.3 alkaline phosphatase 126 SGOT 14 SGPT 12.  Current Status:  Pt is seen today for follow-up.  She reports she is here to go over labs.    Problem List Patient Active Problem List   Diagnosis Date Noted  . Vitamin D deficiency [E55.9] 02/03/2018  . Dyslipidemia [E78.5] 02/03/2018    Past Medical History No past medical history on file.  Past Surgical History No past surgical history on file.  Family History  Family History  Problem Relation Age of Onset  . Healthy Mother      Social History  reports that she has never smoked. She does not have any smokeless tobacco history on file. She reports current alcohol use. She reports that she does not use  drugs.  Medications No current outpatient medications on file.  Allergies Patient has no known allergies.  Review of Systems Review of Systems - Oncology ROS negative   Physical Exam  Vitals Wt Readings from Last 3 Encounters:  04/06/18 203 lb 3.2 oz (92.2 kg)  02/02/18 201 lb 4.8 oz (91.3 kg)   Temp Readings from Last 3 Encounters:  04/06/18 98.5 F (36.9 C) (Oral)  02/02/18 98.7 F (37.1 C) (Oral)  02/23/17 98.4 F (36.9 C) (Oral)   BP Readings from Last 3 Encounters:  04/06/18 (!) 141/94  02/02/18 (!) 151/104  02/23/17 (!) 147/89   Pulse Readings from Last 3 Encounters:  04/06/18 (!) 109  02/02/18 96  02/23/17 (!) 104   Constitutional: Well-developed, well-nourished, and in no distress.   HENT: Head: Normocephalic and atraumatic.  Mouth/Throat: No oropharyngeal exudate. Mucosa moist. Eyes: Pupils are equal, round, and reactive to light. Conjunctivae are normal. No scleral icterus.  Neck: Normal range of motion. Neck supple. No JVD present.  Cardiovascular: Normal rate, regular rhythm and normal heart sounds.  Exam reveals no gallop and no friction rub.   No murmur heard. Pulmonary/Chest: Effort normal and breath sounds normal. No respiratory distress. No wheezes.No rales.  Abdominal: Soft. Bowel sounds are normal. No distension. There is no tenderness. There is no guarding.  Musculoskeletal: No edema or tenderness.  Lymphadenopathy: No cervical, axillary or supraclavicular adenopathy.  Neurological: Alert and oriented to person, place, and time. No cranial nerve deficit.  Skin: Skin is warm and dry. No rash noted. No erythema. No pallor.  Psychiatric: Affect and judgment normal.   Labs No visits with results within 3 Day(s) from this visit.  Latest known visit with results is:  Appointment on 02/02/2018  Component Date Value Ref Range Status  . Erythropoietin 02/02/2018 18.8* 2.6 - 18.5 mIU/mL Final   Comment: (NOTE) [facility] DxI [place] obtained with different assay methods or kits cannot be used interchangeably. Results cannot be interpreted as absolute evidence of the presence or absence of malignant disease. Performed At: [facility] [place], [place] [id number] Rush Farmer MD HD:[id number]      Pathology Orders Placed This Encounter  Procedures  . CBC with Differential/Platelet    Standing Status:   Future    Standing Expiration Date:   04/07/2019  . Comprehensive metabolic panel    Standing Status:   Future    Standing Expiration Date:   04/07/2019  . Lactate dehydrogenase    Standing Status:   Future    Standing Expiration Date:   04/07/2019  . Ferritin    Standing Status:   Future    Standing Expiration Date:   04/07/2019  . Iron and TIBC    Standing Status:   Future    Standing Expiration Date:   04/07/2019  . Transferrin Saturation    Standing Status:   Future    Standing Expiration Date:   04/07/2019  . BCR-ABL1 FISH    Standing Status:   Future    Standing Expiration Date:   04/07/2019  . JAK2 V617F, w Reflex to CALR/E12/MPL    Standing Status:   Future    Standing Expiration  Date:   04/07/2019  . Sedimentation rate    Standing Status:   Future    Standing Expiration Date:   04/07/2019  . C-reactive protein    Standing Status:   Future    Standing Expiration Date:   04/07/2019  . Rheumatoid factor    Standing Status:   Future    Standing Expiration Date:   04/07/2019  . Hemoglobinopathy evaluation    Standing Status:   Future    Standing Expiration Date:   04/07/2019       Zoila Shutter MD

## 2018-04-09 ENCOUNTER — Inpatient Hospital Stay: Payer: No Typology Code available for payment source

## 2018-04-09 DIAGNOSIS — R718 Other abnormality of red blood cells: Secondary | ICD-10-CM

## 2018-04-09 DIAGNOSIS — D473 Essential (hemorrhagic) thrombocythemia: Secondary | ICD-10-CM

## 2018-04-09 DIAGNOSIS — D75839 Thrombocytosis, unspecified: Secondary | ICD-10-CM

## 2018-04-09 DIAGNOSIS — R7989 Other specified abnormal findings of blood chemistry: Secondary | ICD-10-CM | POA: Diagnosis not present

## 2018-04-09 LAB — CBC WITH DIFFERENTIAL/PLATELET
Abs Immature Granulocytes: 0.02 10*3/uL (ref 0.00–0.07)
BASOS ABS: 0 10*3/uL (ref 0.0–0.1)
BASOS PCT: 0 %
Eosinophils Absolute: 0.2 10*3/uL (ref 0.0–0.5)
Eosinophils Relative: 2 %
HCT: 35 % — ABNORMAL LOW (ref 36.0–46.0)
Hemoglobin: 11.6 g/dL — ABNORMAL LOW (ref 12.0–15.0)
Immature Granulocytes: 0 %
Lymphocytes Relative: 28 %
Lymphs Abs: 2.4 10*3/uL (ref 0.7–4.0)
MCH: 27.3 pg (ref 26.0–34.0)
MCHC: 33.1 g/dL (ref 30.0–36.0)
MCV: 82.4 fL (ref 80.0–100.0)
Monocytes Absolute: 0.6 10*3/uL (ref 0.1–1.0)
Monocytes Relative: 7 %
Neutro Abs: 5.5 10*3/uL (ref 1.7–7.7)
Neutrophils Relative %: 63 %
Platelets: 424 10*3/uL — ABNORMAL HIGH (ref 150–400)
RBC: 4.25 MIL/uL (ref 3.87–5.11)
RDW: 14.7 % (ref 11.5–15.5)
WBC: 8.7 10*3/uL (ref 4.0–10.5)
nRBC: 0 % (ref 0.0–0.2)

## 2018-04-09 LAB — COMPREHENSIVE METABOLIC PANEL
ALT: 19 U/L (ref 0–44)
AST: 17 U/L (ref 15–41)
Albumin: 3.6 g/dL (ref 3.5–5.0)
Alkaline Phosphatase: 102 U/L (ref 38–126)
Anion gap: 6 (ref 5–15)
BUN: 12 mg/dL (ref 6–20)
CO2: 24 mmol/L (ref 22–32)
Calcium: 9 mg/dL (ref 8.9–10.3)
Chloride: 107 mmol/L (ref 98–111)
Creatinine, Ser: 0.73 mg/dL (ref 0.44–1.00)
GFR calc Af Amer: >60 mL/min (ref >60)
GFR calc non Af Amer: >60 mL/min (ref >60)
Glucose, Bld: 87 mg/dL (ref 70–99)
Potassium: 4 mmol/L (ref 3.5–5.1)
Sodium: 137 mmol/L (ref 135–145)
TOTAL PROTEIN: 7.4 g/dL (ref 6.5–8.1)
Total Bilirubin: 0.4 mg/dL (ref 0.3–1.2)

## 2018-04-09 LAB — IRON AND TIBC
Iron: 33 ug/dL — ABNORMAL LOW (ref 41–142)
Saturation Ratios: 9 % — ABNORMAL LOW (ref 21–57)
TIBC: 381 ug/dL (ref 236–444)
UIBC: 349 ug/dL (ref 120–384)

## 2018-04-09 LAB — FERRITIN: FERRITIN: 41 ng/mL (ref 11–307)

## 2018-04-09 LAB — SEDIMENTATION RATE: Sed Rate: 24 mm/hr — ABNORMAL HIGH (ref 0–22)

## 2018-04-09 LAB — C-REACTIVE PROTEIN: CRP: 3.7 mg/dL — AB (ref <1.0)

## 2018-04-09 LAB — LACTATE DEHYDROGENASE: LDH: 164 U/L (ref 98–192)

## 2018-04-10 LAB — HEMOGLOBINOPATHY EVALUATION
HGB VARIANT: 0 %
Hgb A2 Quant: 2.2 % (ref 1.8–3.2)
Hgb A: 97.8 % (ref 96.4–98.8)
Hgb C: 0 %
Hgb F Quant: 0 % (ref 0.0–2.0)
Hgb S Quant: 0 %

## 2018-04-10 LAB — RHEUMATOID FACTOR: Rhuematoid fact SerPl-aCnc: <10 IU/mL (ref 0.0–13.9)

## 2018-04-16 ENCOUNTER — Emergency Department (HOSPITAL_COMMUNITY): Payer: No Typology Code available for payment source

## 2018-04-16 ENCOUNTER — Emergency Department (HOSPITAL_COMMUNITY)
Admission: EM | Admit: 2018-04-16 | Discharge: 2018-04-16 | Disposition: A | Discharge status: Home / Self Care | Payer: No Typology Code available for payment source | Attending: Emergency Medicine | Admitting: Emergency Medicine

## 2018-04-16 ENCOUNTER — Encounter (HOSPITAL_COMMUNITY): Payer: Self-pay

## 2018-04-16 ENCOUNTER — Other Ambulatory Visit: Payer: Self-pay

## 2018-04-16 DIAGNOSIS — M7918 Myalgia, other site: Secondary | ICD-10-CM | POA: Diagnosis not present

## 2018-04-16 DIAGNOSIS — J189 Pneumonia, unspecified organism: Secondary | ICD-10-CM | POA: Insufficient documentation

## 2018-04-16 DIAGNOSIS — R509 Fever, unspecified: Secondary | ICD-10-CM | POA: Diagnosis present

## 2018-04-16 DIAGNOSIS — R05 Cough: Secondary | ICD-10-CM | POA: Insufficient documentation

## 2018-04-16 LAB — GROUP A STREP BY PCR: Group A Strep by PCR: NOT DETECTED

## 2018-04-16 MED ORDER — KETOROLAC TROMETHAMINE 30 MG/ML IJ SOLN
15.0000 mg | Freq: Once | INTRAMUSCULAR | Status: AC
  Administered 2018-04-16: 15 mg via INTRAVENOUS
  Filled 2018-04-16: qty 1

## 2018-04-16 MED ORDER — DOXYCYCLINE HYCLATE 100 MG PO CAPS: 100.0000 mg | ORAL_CAPSULE | Freq: Two times a day (BID) | ORAL | 0 refills | Status: DC

## 2018-04-16 MED ORDER — ACETAMINOPHEN 325 MG PO TABS
650.0000 mg | ORAL_TABLET | Freq: Once | ORAL | Status: AC | PRN
  Administered 2018-04-16: 650 mg via ORAL
  Filled 2018-04-16: qty 2

## 2018-04-16 NOTE — ED Provider Notes (Signed)
[facility] DEPT Provider Note   CSN: [id number] Arrival date & time: 04/16/18  1740     History   Chief Complaint Chief Complaint  Patient presents with  . Fever  . Cough  . Generalized Body Aches    HPI Teresa Webster is a 43 y.o. female.  HPI Patient presents with fever shortness of breath and cough for the last 2 days.  Had a cough with green sputum production and aches.  Has had fevers for the last couple days.  Went to [facility] and reportedly had negative flu test.  States patient has been more short of breath and had trouble even walking out to her car.  No swelling in her legs.  No definite sick contacts.  No abdominal pain.  Patient denies possibility of pregnancy. History reviewed. No pertinent past medical history.  Patient Active Problem List   Diagnosis Date Noted  . Vitamin D deficiency 02/03/2018  . Dyslipidemia 02/03/2018    History reviewed. No pertinent surgical history.   OB History   No obstetric history on file.      Home Medications    Prior to Admission medications   Medication Sig Start Date End Date Taking? Authorizing Provider  doxycycline (VIBRAMYCIN) 100 MG capsule Take 1 capsule (100 mg total) by mouth 2 (two) times daily. 04/16/18   Davonna Belling, MD    Family History Family History  Problem Relation Age of Onset  . Healthy Mother     Social History Social History   Tobacco Use  . Smoking status: Never Smoker  . Smokeless tobacco: Never Used  Substance Use Topics  . Alcohol use: Yes  . Drug use: No     Allergies   Patient has no known allergies.   Review of Systems Review of Systems  Constitutional: Positive for appetite change and fever.  HENT: Negative for congestion.   Respiratory: Positive for cough and shortness of breath.   Cardiovascular: Negative for chest pain.  Gastrointestinal: Negative for abdominal distention.  Endocrine: Negative for polyuria.  Genitourinary:  Negative for flank pain.  Musculoskeletal: Positive for myalgias.  Skin: Negative for rash.  Neurological: Negative for weakness.  Psychiatric/Behavioral: Negative for confusion.     Physical Exam Updated Vital Signs BP 119/86 (BP Location: Right Arm)   Pulse 96   Temp 98.3 F (36.8 C) (Oral)   Resp 20   Ht 5\' 2"  (1.575 m)   Wt 90.7 kg   LMP 04/16/2018   SpO2 96%   BMI 36.58 kg/m   Physical Exam Vitals signs and nursing note reviewed.  HENT:     Head: Normocephalic.     Mouth/Throat:     Mouth: Mucous membranes are moist.     Pharynx: Posterior oropharyngeal erythema present. No oropharyngeal exudate.  Eyes:     Pupils: Pupils are equal, round, and reactive to light.  Neck:     Musculoskeletal: Neck supple.  Cardiovascular:     Comments: Tachycardia Pulmonary:     Breath sounds: No wheezing, rhonchi or rales.     Comments: Harsh breath sounds at right base. Abdominal:     Palpations: Abdomen is soft.     Tenderness: There is no abdominal tenderness.  Musculoskeletal:     Right lower leg: No edema.     Left lower leg: No edema.  Skin:    General: Skin is warm.     Capillary Refill: Capillary refill takes less than 2 seconds.  Neurological:  General: No focal deficit present.     Mental Status: She is alert.  Psychiatric:        Mood and Affect: Mood normal.      ED Treatments / Results  Labs (all labs ordered are listed, but only abnormal results are displayed) Labs Reviewed  GROUP A STREP BY PCR    EKG None  Radiology Dg Chest 2 View  Result Date: 04/16/2018 CLINICAL DATA:  Fever and cough. EXAM: CHEST - 2 VIEW COMPARISON:  None. FINDINGS: Heart size is exaggerated by low lung volumes. There is no edema. Mild pulmonary vascular congestion is present. Right basilar airspace opacities are noted. Surgical clips are present at the gallbladder fossa. The visualized soft tissues and bony thorax are unremarkable. IMPRESSION: 1. Asymmetric right lower  lobe airspace disease reflects atelectasis versus pneumonia. 2. Low lung volumes. Electronically Signed   By: San Morelle M.D.   On: 04/16/2018 19:09    Procedures Procedures (including critical care time)  Medications Ordered in ED Medications  acetaminophen (TYLENOL) tablet 650 mg (650 mg Oral Given 04/16/18 1753)  ketorolac (TORADOL) 30 MG/ML injection 15 mg (15 mg Intravenous Given 04/16/18 1923)     Initial Impression / Assessment and Plan / ED Course  I have reviewed the triage vital signs and the nursing notes.  Pertinent labs & imaging results that were available during my care of the patient were reviewed by me and considered in my medical decision making (see chart for details).     Patient presents with shortness of breath and cough.  Fever congestion.  Negative flu test at PCP.  X-ray shows pneumonia and does have localizing findings at that site on auscultation.  Pulse rate has improved.  Not hypoxic.  Will discharge home with doxycycline. follow-up as an outpatient as needed.  Final Clinical Impressions(s) / ED Diagnoses   Final diagnoses:  Community acquired pneumonia of right lung, unspecified part of lung    ED Discharge Orders         Ordered    doxycycline (VIBRAMYCIN) 100 MG capsule  2 times daily     04/16/18 2138           Davonna Belling, MD 04/16/18 2204

## 2018-04-16 NOTE — ED Triage Notes (Addendum)
Patient was at her PCP today for c/o of fever, cough with dark green sputum, and body aches x  2 days.  Patient wstates the flu test was negative.

## 2018-04-16 NOTE — ED Notes (Signed)
Pt is alert and oriented x 4 and is verbally responsive. Pt reports cough ,  fever and chest congestion x 2 days. Pt appears comfortable and without distress.

## 2018-04-18 LAB — CALR + JAK2 E12-15 + MPL (REFLEXED)

## 2018-04-18 LAB — JAK2 V617F, W REFLEX TO CALR/E12/MPL

## 2018-04-20 LAB — BCR-ABL1 FISH
Cells Analyzed: 200
Cells Counted: 200

## 2018-04-27 ENCOUNTER — Telehealth: Payer: Self-pay

## 2018-04-27 ENCOUNTER — Inpatient Hospital Stay: Payer: No Typology Code available for payment source | Attending: Hematology and Oncology | Admitting: Internal Medicine

## 2018-04-27 VITALS — BP 143/92 | HR 101 | Temp 98.3°F | Resp 20 | Ht 62.0 in | Wt 199.3 lb

## 2018-04-27 DIAGNOSIS — E785 Hyperlipidemia, unspecified: Secondary | ICD-10-CM | POA: Diagnosis not present

## 2018-04-27 DIAGNOSIS — D75839 Thrombocytosis, unspecified: Secondary | ICD-10-CM

## 2018-04-27 DIAGNOSIS — D473 Essential (hemorrhagic) thrombocythemia: Secondary | ICD-10-CM | POA: Diagnosis present

## 2018-04-27 DIAGNOSIS — E559 Vitamin D deficiency, unspecified: Secondary | ICD-10-CM | POA: Insufficient documentation

## 2018-04-27 NOTE — Progress Notes (Signed)
Diagnosis Thrombocytosis ([facility]) - Plan: CBC with Differential ([facility] Only), CMP ([facility] only), Lactate dehydrogenase (LDH), Ferritin  Staging Cancer Staging No matching staging information was found for the patient.  Assessment and Plan:  1.  Thrombocytosis.  43 year old female previously followed by Dr. Audelia Hives.  PT had  laboratory studies from March 20, 2007 a complete blood count with hemoglobin 12.3 hematocrit 35.9 WBC 7.5; platelets 437,000.  More recently, on December 30, 2017: A repeat complete blood count showed hemoglobin 12.0 hematocrit 36.6 MCV 83 MCH 27.2 RDW 13.8 WBC 6.9 with 48% neutrophils 42% lymphocytes 8% monocytes 2% eosinophils; platelets 512,000.  TSH 1.67 vitamin D 11.6.  A comprehensive metabolic panel shows sodium 139 potassium 4.2 chloride 102 CO2 21 glucose 80 BUN 10 creatinine 0.7 calcium 9.8 total protein 7.3 albumin 4.3 total bilirubin 0.3 alkaline phosphatase 126 SGOT 14 SGPT 12.  Pt had blood work done at [facility] on 02/02/2018 that showed WBC 8.4 HB 11.9 plts 560,000.  She has normal differential.  Chemistries WNL with Cr 0.71 K+ 4 normal LFTs.  Ferritin WNL at 62.   Labs done 04/09/2018 reviewed and showed WBC 8.7 HB 11.6 plts 424,000.  Chemistries WNL with K+ 4 Cr 0,73 and normal LFTs.  LDH 164.  Ferritin 41.  CRP 3.7, Sed rate 24 RF 10.  HB electrophoresis WNL.  JaK2 and BCR/ABL WNL.  I discussed with her plt count is WNL at 424,000.  She has mild elevation in inflammatory markers which may contribute to fluctuation in plt count.  She has a low normal ferritin of 41.  Iron deficiency may also play a role in thrombocytosis.  I discussed with her this is a normal variant.  She will have repeat labs in 10/2018.    2.  Microcytosis.  MCV 82.  She has low normal ferritin of 41.  Hemoglobin electrophoresis to evaluate for hemoglobinopathy or trait condition showed normal adult HB.  Pt has option of MVI with iron.   Interval History: Historical data obtained  from note dated 02/02/2018:  43 yr old female previously followed by Dr. Audelia Hives.  Laboratory studies from March 20, 2007 a complete blood count with hemoglobin 12.3 hematocrit 35.9 WBC 7.5; platelets 437,000.  More recently, on December 29, 2017: A repeat complete blood count showed hemoglobin 12.0 hematocrit 36.6 MCV 83 MCH 27.2 RDW 13.8 WBC 6.9 with 48% neutrophils 42% lymphocytes 8% monocytes 2% eosinophils; platelets 512,000.  TSH 1.67 vitamin D 11.6.  A comprehensive metabolic panel shows sodium 139 potassium 4.2 chloride 102 CO2 21 glucose 80 BUN 10 creatinine 0.7 calcium 9.8 total protein 7.3 albumin 4.3 total bilirubin 0.3 alkaline phosphatase 126 SGOT 14 SGPT 12.  Current Status:  Pt is seen today for follow-up.  She is here to go over labs.    Problem List Patient Active Problem List   Diagnosis Date Noted  . Vitamin D deficiency [E55.9] 02/03/2018  . Dyslipidemia [E78.5] 02/03/2018    Past Medical History No past medical history on file.  Past Surgical History No past surgical history on file.  Family History Family History  Problem Relation Age of Onset  . Healthy Mother      Social History  reports that she has never smoked. She has never used smokeless tobacco. She reports current alcohol use. She reports that she does not use drugs.  Medications No current outpatient medications on file.  Allergies Patient has no known allergies.  Review of Systems Review of Systems -  Oncology ROS negative   Physical Exam  Vitals Wt Readings from Last 3 Encounters:  04/27/18 199 lb 4.8 oz (90.4 kg)  04/16/18 200 lb (90.7 kg)  04/06/18 203 lb 3.2 oz (92.2 kg)   Temp Readings from Last 3 Encounters:  04/27/18 98.3 F (36.8 C) (Oral)  04/16/18 98.3 F (36.8 C) (Oral)  04/06/18 98.5 F (36.9 C) (Oral)   BP Readings from Last 3 Encounters:  04/27/18 (!) 143/92  04/16/18 119/86  04/06/18 (!) 141/94   Pulse Readings from Last 3 Encounters:  04/27/18 (!) 101  04/16/18  96  04/06/18 (!) 109   Constitutional: Well-developed, well-nourished, and in no distress.   HENT: Head: Normocephalic and atraumatic.  Mouth/Throat: No oropharyngeal exudate. Mucosa moist. Eyes: Pupils are equal, round, and reactive to light. Conjunctivae are normal. No scleral icterus.  Neck: Normal range of motion. Neck supple. No JVD present.  Cardiovascular: Normal rate, regular rhythm and normal heart sounds.  Exam reveals no gallop and no friction rub.   No murmur heard. Pulmonary/Chest: Effort normal and breath sounds normal. No respiratory distress. No wheezes.No rales.  Abdominal: Soft. Bowel sounds are normal. No distension. There is no tenderness. There is no guarding. Pt has navel piercing noted.   Musculoskeletal: No edema or tenderness.  Lymphadenopathy: No cervical, axillary or supraclavicular adenopathy.  Neurological: Alert and oriented to person, place, and time. No cranial nerve deficit.  Skin: Skin is warm and dry. No rash noted. No erythema. No pallor.  Psychiatric: Affect and judgment normal.   Labs No visits with results within 3 Day(s) from this visit.  Latest known visit with results is:  Admission on 04/16/2018, Discharged on 04/16/2018  Component Date Value Ref Range Status  . Group A Strep by PCR 04/16/2018 NOT DETECTED  NOT DETECTED Final   Performed at [facility] [address] [place], [place] [address]     Pathology Orders Placed This Encounter  Procedures  . CBC with Differential ([facility] Only)    Standing Status:   Future    Standing Expiration Date:   04/28/2019  . CMP ([facility] only)    Standing Status:   Future    Standing Expiration Date:   04/28/2019  . Lactate dehydrogenase (LDH)    Standing Status:   Future    Standing Expiration Date:   04/28/2019  . Ferritin    Standing Status:   Future    Standing Expiration Date:   04/28/2019       Zoila Shutter MD

## 2018-04-27 NOTE — Telephone Encounter (Signed)
Printed avs and calender of upcoming appointment. Per 2/14 los 

## 2018-10-24 ENCOUNTER — Telehealth: Payer: Self-pay | Admitting: Hematology

## 2018-10-24 NOTE — Telephone Encounter (Signed)
[facility] transfer to [facility] moved 8/21 f/u to August and added lab after per [place]. Lab for 8/14 remains the same. Confirmed with patient.

## 2018-10-26 ENCOUNTER — Other Ambulatory Visit: Payer: Self-pay

## 2018-10-26 ENCOUNTER — Inpatient Hospital Stay: Payer: No Typology Code available for payment source | Attending: Internal Medicine

## 2018-10-26 DIAGNOSIS — E559 Vitamin D deficiency, unspecified: Secondary | ICD-10-CM | POA: Diagnosis not present

## 2018-10-26 DIAGNOSIS — Z79899 Other long term (current) drug therapy: Secondary | ICD-10-CM | POA: Insufficient documentation

## 2018-10-26 DIAGNOSIS — D75839 Thrombocytosis, unspecified: Secondary | ICD-10-CM

## 2018-10-26 DIAGNOSIS — E785 Hyperlipidemia, unspecified: Secondary | ICD-10-CM | POA: Diagnosis not present

## 2018-10-26 DIAGNOSIS — D473 Essential (hemorrhagic) thrombocythemia: Secondary | ICD-10-CM | POA: Insufficient documentation

## 2018-10-26 DIAGNOSIS — E669 Obesity, unspecified: Secondary | ICD-10-CM | POA: Insufficient documentation

## 2018-10-26 DIAGNOSIS — R7989 Other specified abnormal findings of blood chemistry: Secondary | ICD-10-CM | POA: Diagnosis not present

## 2018-10-26 DIAGNOSIS — R718 Other abnormality of red blood cells: Secondary | ICD-10-CM | POA: Diagnosis present

## 2018-10-26 LAB — CMP (CANCER CENTER ONLY)
ALT: 26 U/L (ref 0–44)
AST: 21 U/L (ref 15–41)
Albumin: 3.9 g/dL (ref 3.5–5.0)
Alkaline Phosphatase: 98 U/L (ref 38–126)
Anion gap: 9 (ref 5–15)
BUN: 9 mg/dL (ref 6–20)
CO2: 24 mmol/L (ref 22–32)
Calcium: 9.2 mg/dL (ref 8.9–10.3)
Chloride: 104 mmol/L (ref 98–111)
Creatinine: 0.78 mg/dL (ref 0.44–1.00)
GFR, Est AFR Am: >60 mL/min (ref >60)
GFR, Estimated: >60 mL/min (ref >60)
Glucose, Bld: 99 mg/dL (ref 70–99)
Potassium: 3.3 mmol/L — ABNORMAL LOW (ref 3.5–5.1)
Sodium: 137 mmol/L (ref 135–145)
Total Bilirubin: 0.4 mg/dL (ref 0.3–1.2)
Total Protein: 7.4 g/dL (ref 6.5–8.1)

## 2018-10-26 LAB — CBC WITH DIFFERENTIAL (CANCER CENTER ONLY)
Abs Immature Granulocytes: 0.01 10*3/uL (ref 0.00–0.07)
Basophils Absolute: 0 10*3/uL (ref 0.0–0.1)
Basophils Relative: 0 %
Eosinophils Absolute: 0.2 10*3/uL (ref 0.0–0.5)
Eosinophils Relative: 2 %
HCT: 37.4 % (ref 36.0–46.0)
Hemoglobin: 12 g/dL (ref 12.0–15.0)
Immature Granulocytes: 0 %
Lymphocytes Relative: 36 %
Lymphs Abs: 2.9 10*3/uL (ref 0.7–4.0)
MCH: 26.9 pg (ref 26.0–34.0)
MCHC: 32.1 g/dL (ref 30.0–36.0)
MCV: 83.9 fL (ref 80.0–100.0)
Monocytes Absolute: 0.5 10*3/uL (ref 0.1–1.0)
Monocytes Relative: 6 %
Neutro Abs: 4.5 10*3/uL (ref 1.7–7.7)
Neutrophils Relative %: 56 %
Platelet Count: 477 10*3/uL — ABNORMAL HIGH (ref 150–400)
RBC: 4.46 MIL/uL (ref 3.87–5.11)
RDW: 14.2 % (ref 11.5–15.5)
WBC Count: 8.1 10*3/uL (ref 4.0–10.5)
nRBC: 0 % (ref 0.0–0.2)

## 2018-10-26 LAB — LACTATE DEHYDROGENASE: LDH: 132 U/L (ref 98–192)

## 2018-10-26 LAB — FERRITIN: Ferritin: 88 ng/mL (ref 11–307)

## 2018-11-02 ENCOUNTER — Inpatient Hospital Stay: Payer: No Typology Code available for payment source

## 2018-11-02 ENCOUNTER — Telehealth: Payer: Self-pay | Admitting: Hematology

## 2018-11-02 ENCOUNTER — Other Ambulatory Visit: Payer: Self-pay

## 2018-11-02 ENCOUNTER — Inpatient Hospital Stay: Payer: No Typology Code available for payment source | Admitting: Hematology

## 2018-11-02 VITALS — BP 125/75 | HR 96 | Temp 98.5°F | Resp 18 | Ht 62.0 in | Wt 193.2 lb

## 2018-11-02 DIAGNOSIS — D473 Essential (hemorrhagic) thrombocythemia: Secondary | ICD-10-CM | POA: Diagnosis not present

## 2018-11-02 DIAGNOSIS — D75839 Thrombocytosis, unspecified: Secondary | ICD-10-CM

## 2018-11-02 NOTE — Progress Notes (Signed)
HEMATOLOGY/ONCOLOGY CLINIC NOTE  Date of Service: 11/02/2018  Patient Care Team: Harlan Stains, MD as PCP - General (Family Medicine)  CHIEF COMPLAINTS/PURPOSE OF CONSULTATION:   Thrombocytosis   HISTORY OF PRESENTING ILLNESS: see previous note by Dr Walden Field  INTERVAL HISTORY  Patient is here for f/u of her thrombocytosis . She was previously seen by Dr Walden Field. She notes no h/o VTE. No fevers/chills/new bone pains.  The pt reports no new concerns, and a normal period since her ablation.    Most recent lab results (10/26/2018) of CBC is as follows: all values are WNL except for Platelet count at 477K, Potassium at 3.3.  10/26/2018 LDH at 132  10/26/2018 Ferritin at 88  On review of systems, pt denies hydrinenitis, any gum disorders, abnormal vaginal discharge, abdominal pain and any other symptoms.   MEDICAL HISTORY:  No past medical history on file.  SURGICAL HISTORY: No past surgical history on file.  SOCIAL HISTORY: Social History   Socioeconomic History  . Marital status: Married    Spouse name: Not on file  . Number of children: Not on file  . Years of education: Not on file  . Highest education level: Not on file  Occupational History  . Not on file  Social Needs  . Financial resource strain: Not on file  . Food insecurity    Worry: Not on file    Inability: Not on file  . Transportation needs    Medical: Not on file    Non-medical: Not on file  Tobacco Use  . Smoking status: Never Smoker  . Smokeless tobacco: Never Used  Substance and Sexual Activity  . Alcohol use: Yes  . Drug use: No  . Sexual activity: Not on file  Lifestyle  . Physical activity    Days per week: Not on file    Minutes per session: Not on file  . Stress: Not on file  Relationships  . Social Herbalist on phone: Not on file    Gets together: Not on file    Attends religious service: Not on file    Active member of club or organization: Not on file    Attends  meetings of clubs or organizations: Not on file    Relationship status: Not on file  . Intimate partner violence    Fear of current or ex partner: Not on file    Emotionally abused: Not on file    Physically abused: Not on file    Forced sexual activity: Not on file  Other Topics Concern  . Not on file  Social History Narrative  . Not on file    FAMILY HISTORY: Family History  Problem Relation Age of Onset  . Healthy Mother     ALLERGIES:  has No Known Allergies.  MEDICATIONS:  No current outpatient medications on file.   No current facility-administered medications for this visit.     REVIEW OF SYSTEMS:    10 Point review of Systems was done is negative except as noted above.  PHYSICAL EXAMINATION: ECOG PERFORMANCE STATUS: 0 - Asymptomatic  . Vitals:   11/02/18 0919  BP: 125/75  Pulse: 96  Resp: 18  Temp: 98.5 F (36.9 C)  SpO2: 100%   Filed Weights   11/02/18 0919  Weight: 193 lb 3.2 oz (87.6 kg)   .Body mass index is 35.34 kg/m.  GENERAL:alert, in no acute distress and comfortable SKIN: no acute rashes, no significant lesions EYES: conjunctiva are pink  and non-injected, sclera anicteric OROPHARYNX: MMM, no exudates, no oropharyngeal erythema or ulceration NECK: supple, no JVD LYMPH:  no palpable lymphadenopathy in the cervical, axillary or inguinal regions LUNGS: clear to auscultation b/l with normal respiratory effort HEART: regular rate & rhythm ABDOMEN:  normoactive bowel sounds , non tender, not distended. No palpable hepatosplenomegaly. Extremity: no pedal edema PSYCH: alert & oriented x 3 with fluent speech NEURO: no focal motor/sensory deficits  LABORATORY DATA:  I have reviewed the data as listed  . CBC Latest Ref Rng & Units 10/26/2018 04/09/2018 03/20/2007  WBC 4.0 - 10.5 K/uL 8.1 8.7 7.5  Hemoglobin 12.0 - 15.0 g/dL 12.0 11.6(L) 12.3  Hematocrit 36.0 - 46.0 % 37.4 35.0(L) 35.9(L)  Platelets 150 - 400 K/uL 477(H) 424(H) 437(H)    .  CMP Latest Ref Rng & Units 10/26/2018 04/09/2018 03/20/2007  Glucose 70 - 99 mg/dL 99 87 93  BUN 6 - 20 mg/dL 9 12 18   Creatinine 0.44 - 1.00 mg/dL 0.78 0.73 0.75  Sodium 135 - 145 mmol/L 137 137 141  Potassium 3.5 - 5.1 mmol/L 3.3(L) 4.0 4.3  Chloride 98 - 111 mmol/L 104 107 110  CO2 22 - 32 mmol/L 24 24 28   Calcium 8.9 - 10.3 mg/dL 9.2 9.0 9.7  Total Protein 6.5 - 8.1 g/dL 7.4 7.4 7.0  Total Bilirubin 0.3 - 1.2 mg/dL 0.4 0.4 0.5  Alkaline Phos 38 - 126 U/L 98 102 64  AST 15 - 41 U/L 21 17 17   ALT 0 - 44 U/L 26 19 16    . Lab Results  Component Value Date   IRON 33 (L) 04/09/2018   TIBC 381 04/09/2018   IRONPCTSAT 9 (L) 04/09/2018   (Iron and TIBC)  Lab Results  Component Value Date   FERRITIN 88 10/26/2018    BCR-ABL1 FISH Order: [id number] Status:  Final result Visible to patient:  Yes (MyChart) Next appt:  11/01/2019 at 09:30 AM in Oncology [facility] Lab 2) Dx:  Thrombocytosis ([place]); Microcytosis Component 10mo ago  Specimen Type BLOOD   Cells Counted 200   Cells Analyzed 200   FISH Result Comment:   Comment: NORMAL: NO BCR OR ABL1 GENE REARRANGEMENT OBSERVED       CALR Mutation Detection Result Comment   Comment: (NOTE)  NEGATIVE    JAK2 Exons 12-15 Mut Det PCR: Comment   Comment: (NOTE)  NEGATIVE    JAK2 GenotypR Comment   Comment: (NOTE)  Result: NEGATIVE for the JAK2 V617F mutation.      RADIOGRAPHIC STUDIES: I have personally reviewed the radiological images as listed and agreed with the findings in the report. No results found.  ASSESSMENT & PLAN:   1. 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 Ferritin 88 PLAN -no indication of clonal thrombocytosis at this time. -platelet still borderline elevated at 477k -.Body mass index is 35.34 kg/m. -could be other source of inflammation or obesity. Counseled on diet and exercise to address obesity -no clear indication for BM BX at  this time. -continue f/u with PCP  RTC with Dr Irene Limbo with labs in 12 months   All of the patients questions were answered with apparent satisfaction. The patient knows to call the clinic with any problems, questions or concerns.  I spent 10 minutes counseling the patient face to face. The total time spent in the appointment was 15 minutes and more than 50% was on counseling and direct patient cares.    Gautam  Irene Limbo MD [place] AAHIVMS [facility] Hematology/Oncology Physician [facility]  (Office):       [phone] (Work cell):  [phone] (Fax):           [phone]  11/02/2018 9:42 AM

## 2018-11-02 NOTE — Telephone Encounter (Signed)
Gave avs and calendar ° °

## 2018-11-05 ENCOUNTER — Other Ambulatory Visit: Payer: No Typology Code available for payment source

## 2019-02-15 ENCOUNTER — Other Ambulatory Visit: Payer: Self-pay | Admitting: Cardiology

## 2019-02-15 DIAGNOSIS — Z20822 Contact with and (suspected) exposure to covid-19: Secondary | ICD-10-CM

## 2019-02-18 LAB — NOVEL CORONAVIRUS, NAA: SARS-CoV-2, NAA: NOT DETECTED

## 2019-06-21 ENCOUNTER — Ambulatory Visit: Payer: No Typology Code available for payment source

## 2019-06-22 ENCOUNTER — Other Ambulatory Visit: Payer: Self-pay

## 2019-06-22 ENCOUNTER — Ambulatory Visit: Payer: No Typology Code available for payment source | Attending: Internal Medicine

## 2019-06-22 DIAGNOSIS — Z23 Encounter for immunization: Secondary | ICD-10-CM

## 2019-06-22 NOTE — Progress Notes (Signed)
   Covid-19 Vaccination Clinic  Name:  Teresa Webster    MRN: [id number] DOB: August 17, 1975  06/22/2019  Ms. Stegner was observed post Covid-19 immunization for 15 minutes without incident. She was provided with Vaccine Information Sheet and instruction to access the V-Safe system.   Ms. Jaros was instructed to call 911 with any severe reactions post vaccine: Marland Kitchen Difficulty breathing  . Swelling of face and throat  . A fast heartbeat  . A bad rash all over body  . Dizziness and weakness   Immunizations Administered    Name Date Dose VIS Date Route   Pfizer COVID-19 Vaccine 06/22/2019 12:37 PM 0.3 mL 02/22/2019 Intramuscular   Manufacturer: [place]   Lot: [id number]   [place]: [id number]

## 2019-07-15 ENCOUNTER — Ambulatory Visit: Payer: No Typology Code available for payment source | Attending: Internal Medicine

## 2019-07-15 DIAGNOSIS — Z23 Encounter for immunization: Secondary | ICD-10-CM

## 2019-07-15 NOTE — Progress Notes (Signed)
   Covid-19 Vaccination Clinic  Name:  Teresa Webster    MRN: [id number] DOB: 1975-09-19  07/15/2019  [name] was observed post Covid-19 immunization for 15 minutes without incident. She was provided with Vaccine Information Sheet and instruction to access the V-Safe system.   [name] was instructed to call 911 with any severe reactions post vaccine: Marland Kitchen Difficulty breathing  . Swelling of face and throat  . A fast heartbeat  . A bad rash all over body  . Dizziness and weakness   Immunizations Administered    Name Date Dose VIS Date Route   Pfizer COVID-19 Vaccine 07/15/2019  4:00 PM 0.3 mL 05/08/2018 Intramuscular   Manufacturer: [place]   Lot: [id number]   [place]: [id number]

## 2019-10-18 ENCOUNTER — Telehealth: Payer: Self-pay | Admitting: Hematology

## 2019-10-18 NOTE — Telephone Encounter (Signed)
Rescheduled appointment from 08/20 to 09/17 due to scheduling change, patient has been called and notified.

## 2019-11-01 ENCOUNTER — Other Ambulatory Visit: Payer: No Typology Code available for payment source

## 2019-11-01 ENCOUNTER — Ambulatory Visit: Payer: No Typology Code available for payment source | Admitting: Hematology

## 2019-11-07 ENCOUNTER — Ambulatory Visit
Admission: RE | Admit: 2019-11-07 | Discharge: 2019-11-07 | Disposition: A | Discharge status: Home / Self Care | Payer: No Typology Code available for payment source | Source: Ambulatory Visit | Attending: Physician Assistant | Admitting: Physician Assistant

## 2019-11-07 ENCOUNTER — Other Ambulatory Visit: Payer: Self-pay | Admitting: Physician Assistant

## 2019-11-07 DIAGNOSIS — Z01818 Encounter for other preprocedural examination: Secondary | ICD-10-CM

## 2019-11-29 ENCOUNTER — Inpatient Hospital Stay: Payer: No Typology Code available for payment source | Attending: Hematology

## 2019-11-29 ENCOUNTER — Inpatient Hospital Stay (HOSPITAL_BASED_OUTPATIENT_CLINIC_OR_DEPARTMENT_OTHER): Payer: No Typology Code available for payment source | Admitting: Hematology

## 2019-11-29 ENCOUNTER — Other Ambulatory Visit: Payer: Self-pay

## 2019-11-29 VITALS — BP 134/87 | HR 92 | Temp 98.6°F | Resp 18 | Ht 62.0 in | Wt 193.4 lb

## 2019-11-29 DIAGNOSIS — D473 Essential (hemorrhagic) thrombocythemia: Secondary | ICD-10-CM | POA: Diagnosis not present

## 2019-11-29 DIAGNOSIS — D75839 Thrombocytosis, unspecified: Secondary | ICD-10-CM

## 2019-11-29 LAB — CBC WITH DIFFERENTIAL/PLATELET
Abs Immature Granulocytes: 0.02 10*3/uL (ref 0.00–0.07)
Basophils Absolute: 0 10*3/uL (ref 0.0–0.1)
Basophils Relative: 1 %
Eosinophils Absolute: 0.2 10*3/uL (ref 0.0–0.5)
Eosinophils Relative: 3 %
HCT: 36.5 % (ref 36.0–46.0)
Hemoglobin: 11.9 g/dL — ABNORMAL LOW (ref 12.0–15.0)
Immature Granulocytes: 0 %
Lymphocytes Relative: 46 %
Lymphs Abs: 3.1 10*3/uL (ref 0.7–4.0)
MCH: 27.4 pg (ref 26.0–34.0)
MCHC: 32.6 g/dL (ref 30.0–36.0)
MCV: 84.1 fL (ref 80.0–100.0)
Monocytes Absolute: 0.7 10*3/uL (ref 0.1–1.0)
Monocytes Relative: 10 %
Neutro Abs: 2.7 10*3/uL (ref 1.7–7.7)
Neutrophils Relative %: 40 %
Platelets: 358 10*3/uL (ref 150–400)
RBC: 4.34 MIL/uL (ref 3.87–5.11)
RDW: 14.1 % (ref 11.5–15.5)
WBC: 6.7 10*3/uL (ref 4.0–10.5)
nRBC: 0 % (ref 0.0–0.2)

## 2019-11-29 NOTE — Progress Notes (Signed)
HEMATOLOGY/ONCOLOGY CLINIC NOTE  Date of Service: 11/29/2019  Patient Care Team: Harlan Stains, MD as PCP - General (Family Medicine)  CHIEF COMPLAINTS/PURPOSE OF CONSULTATION:   Thrombocytosis   HISTORY OF PRESENTING ILLNESS: see previous note by Dr Walden Field  INTERVAL HISTORY Teresa Webster is here today for the evaluation and management of her thrombocytosis. The patient's last visit with [place] was on 11/02/2018. The pt reports that she is doing well overall.  The pt reports that she has felt well since our last visit and has no new concerns. Pt recently had labs with her primary that showed borderline elevated PLT. Her menstrual cycles have been regular. She used to have heavier periods prior to her endometrial ablation, which was over five years ago.   Pt has received her COVID19 vaccines.   Lab results today (11/29/19) of CBC w/diff is as follows: all values are WNL except for Hgb at 11.9.  On review of systems, pt denies new bone pain, fevers, chills, night sweats, unexpected weight loss, abdominal pain, diarrhea, constipation, joint pain, rash, leg swelling and any other symptoms.    MEDICAL HISTORY:  No past medical history on file.  SURGICAL HISTORY: No past surgical history on file.  SOCIAL HISTORY: Social History   Socioeconomic History  . Marital status: Married    Spouse name: Not on file  . Number of children: Not on file  . Years of education: Not on file  . Highest education level: Not on file  Occupational History  . Not on file  Tobacco Use  . Smoking status: Never Smoker  . Smokeless tobacco: Never Used  Vaping Use  . Vaping Use: Never used  Substance and Sexual Activity  . Alcohol use: Yes  . Drug use: No  . Sexual activity: Not on file  Other Topics Concern  . Not on file  Social History Narrative  . Not on file   Social Determinants of Health   Financial Resource Strain:   . Difficulty of Paying Living Expenses: Not on file  Food  Insecurity:   . Worried About Charity fundraiser in the Last Year: Not on file  . Ran Out of Food in the Last Year: Not on file  Transportation Needs:   . Lack of Transportation (Medical): Not on file  . Lack of Transportation (Non-Medical): Not on file  Physical Activity:   . Days of Exercise per Week: Not on file  . Minutes of Exercise per Session: Not on file  Stress:   . Feeling of Stress : Not on file  Social Connections:   . Frequency of Communication with Friends and Family: Not on file  . Frequency of Social Gatherings with Friends and Family: Not on file  . Attends Religious Services: Not on file  . Active Member of Clubs or Organizations: Not on file  . Attends Archivist Meetings: Not on file  . Marital Status: Not on file  Intimate Partner Violence:   . Fear of Current or Ex-Partner: Not on file  . Emotionally Abused: Not on file  . Physically Abused: Not on file  . Sexually Abused: Not on file    FAMILY HISTORY: Family History  Problem Relation Age of Onset  . Healthy Mother     ALLERGIES:  has No Known Allergies.  MEDICATIONS:  No current outpatient medications on file.   No current facility-administered medications for this visit.    REVIEW OF SYSTEMS:   A 10+ POINT REVIEW OF  SYSTEMS WAS OBTAINED including neurology, dermatology, psychiatry, cardiac, respiratory, lymph, extremities, GI, GU, Musculoskeletal, constitutional, breasts, reproductive, HEENT.  All pertinent positives are noted in the HPI.  All others are negative.   PHYSICAL EXAMINATION: ECOG PERFORMANCE STATUS: 0 - Asymptomatic  . Vitals:   11/29/19 1255  BP: 134/87  Pulse: 92  Resp: 18  Temp: 98.6 F (37 C)  SpO2: 100%   Filed Weights   11/29/19 1255  Weight: 193 lb 6.4 oz (87.7 kg)   .Body mass index is 35.37 kg/m.  GENERAL:alert, in no acute distress and comfortable SKIN: no acute rashes, no significant lesions EYES: conjunctiva are pink and non-injected, sclera  anicteric OROPHARYNX: MMM, no exudates, no oropharyngeal erythema or ulceration NECK: supple, no JVD LYMPH:  no palpable lymphadenopathy in the cervical, axillary or inguinal regions LUNGS: clear to auscultation b/l with normal respiratory effort HEART: regular rate & rhythm ABDOMEN:  normoactive bowel sounds , non tender, not distended. No palpable hepatosplenomegaly.  Extremity: no pedal edema PSYCH: alert & oriented x 3 with fluent speech NEURO: no focal motor/sensory deficits  LABORATORY DATA:  I have reviewed the data as listed  . CBC Latest Ref Rng & Units 11/29/2019 10/26/2018 04/09/2018  WBC 4.0 - 10.5 K/uL 6.7 8.1 8.7  Hemoglobin 12.0 - 15.0 g/dL 11.9(L) 12.0 11.6(L)  Hematocrit 36 - 46 % 36.5 37.4 35.0(L)  Platelets 150 - 400 K/uL 358 477(H) 424(H)    . CMP Latest Ref Rng & Units 10/26/2018 04/09/2018 03/20/2007  Glucose 70 - 99 mg/dL 99 87 93  BUN 6 - 20 mg/dL 9 12 18   Creatinine 0.44 - 1.00 mg/dL 0.78 0.73 0.75  Sodium 135 - 145 mmol/L 137 137 141  Potassium 3.5 - 5.1 mmol/L 3.3(L) 4.0 4.3  Chloride 98 - 111 mmol/L 104 107 110  CO2 22 - 32 mmol/L 24 24 28   Calcium 8.9 - 10.3 mg/dL 9.2 9.0 9.7  Total Protein 6.5 - 8.1 g/dL 7.4 7.4 7.0  Total Bilirubin 0.3 - 1.2 mg/dL 0.4 0.4 0.5  Alkaline Phos 38 - 126 U/L 98 102 64  AST 15 - 41 U/L 21 17 17   ALT 0 - 44 U/L 26 19 16    . Lab Results  Component Value Date   IRON 33 (L) 04/09/2018   TIBC 381 04/09/2018   IRONPCTSAT 9 (L) 04/09/2018   (Iron and TIBC)  Lab Results  Component Value Date   FERRITIN 88 10/26/2018    BCR-ABL1 FISH Order: [id number] Status:  Final result Visible to patient:  Yes (MyChart) Next appt:  11/01/2019 at 09:30 AM in Oncology [facility] Lab 2) Dx:  Thrombocytosis ([place]); Microcytosis Component 73mo ago  Specimen Type BLOOD   Cells Counted 200   Cells Analyzed 200   FISH Result Comment:   Comment: NORMAL: NO BCR OR ABL1 GENE REARRANGEMENT OBSERVED       CALR Mutation Detection  Result Comment   Comment: (NOTE)  NEGATIVE    JAK2 Exons 12-15 Mut Det PCR: Comment   Comment: (NOTE)  NEGATIVE    JAK2 GenotypR Comment   Comment: (NOTE)  Result: NEGATIVE for the JAK2 V617F mutation.      RADIOGRAPHIC STUDIES: I have personally reviewed the radiological images as listed and agreed with the findings in the report. DG Chest 2 View  Result Date: 11/07/2019 CLINICAL DATA:  Preop examination. EXAM: CHEST - 2 VIEW COMPARISON:  Radiograph 04/16/2018 FINDINGS: Previous right basilar atelectasis has resolved.The cardiomediastinal contours are normal. The lungs are clear.  Pulmonary vasculature is normal. No consolidation, pleural effusion, or pneumothorax. No acute osseous abnormalities are seen. IMPRESSION: Negative radiographs of the chest. Electronically Signed   By: Keith Rake M.D.   On: 11/07/2019 22:51    ASSESSMENT & PLAN:   1. 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 Ferritin 88 PLAN: -Discussed pt labwork today, 11/29/19; blood counts are nml, PLT have normalized.  -Advised pt that her PLT stability is reassuring. If this was a primary bone marrow problem we would see continued elevation of PLT.  -Advised pt that her thrombocytosis is most likely reactive. JAK2,CALR, MPL mutations were negative. -Advised pt that an autoimmune disorder could cause this kind of inflammation - pt has no symptomology to reflect this.  -Recommended that the pt continue to eat well, drink at least 48-64 oz of water each day, and walk 20-30 minutes each day.  -Continue f/u with PCP -Will see back as needed   FOLLOW UP: RTC with PCP. No hematology f/u indicated.   The total time spent in the appt was 20 minutes and more than 50% was on counseling and direct patient cares.  All of the patient's questions were answered with apparent satisfaction. The patient knows to call the clinic with any problems,  questions or concerns.    Sullivan Lone MD [place] AAHIVMS [facility] Hematology/Oncology Physician [facility]  (Office):       [phone] (Work cell):  [phone] (Fax):           [phone]  11/29/2019 2:17 PM  I, Yevette Edwards, am acting as a scribe for Dr. Sullivan Lone.   I have reviewed the above documentation for accuracy and completeness, and I agree with the above. Brunetta Genera MD

## 2019-12-03 ENCOUNTER — Other Ambulatory Visit: Payer: No Typology Code available for payment source

## 2019-12-03 ENCOUNTER — Ambulatory Visit: Payer: No Typology Code available for payment source | Admitting: Hematology

## 2020-04-24 ENCOUNTER — Ambulatory Visit
Admission: RE | Admit: 2020-04-24 | Discharge: 2020-04-24 | Disposition: A | Discharge status: Home / Self Care | Payer: No Typology Code available for payment source | Source: Ambulatory Visit | Attending: *Deleted | Admitting: *Deleted

## 2020-04-24 ENCOUNTER — Other Ambulatory Visit: Payer: Self-pay | Admitting: *Deleted

## 2020-04-24 DIAGNOSIS — Z01811 Encounter for preprocedural respiratory examination: Secondary | ICD-10-CM

## 2021-07-08 ENCOUNTER — Telehealth: Payer: Self-pay | Admitting: Hematology

## 2021-07-08 NOTE — Telephone Encounter (Signed)
Scheduled appt per 4/27 referral. Pt is aware of appt date and time. Pt is aware to arrive 15 mins prior to appt time and to bring and updated insurance card. Pt is aware of appt location.   ?

## 2021-07-13 IMAGING — CR DG CHEST 2V
2 series · 2 of 2 positions shown · non-contrast
Comparison: Radiograph 04/16/2018

CLINICAL DATA: Preop examination.

EXAM:
CHEST - 2 VIEW

[w chest pa]
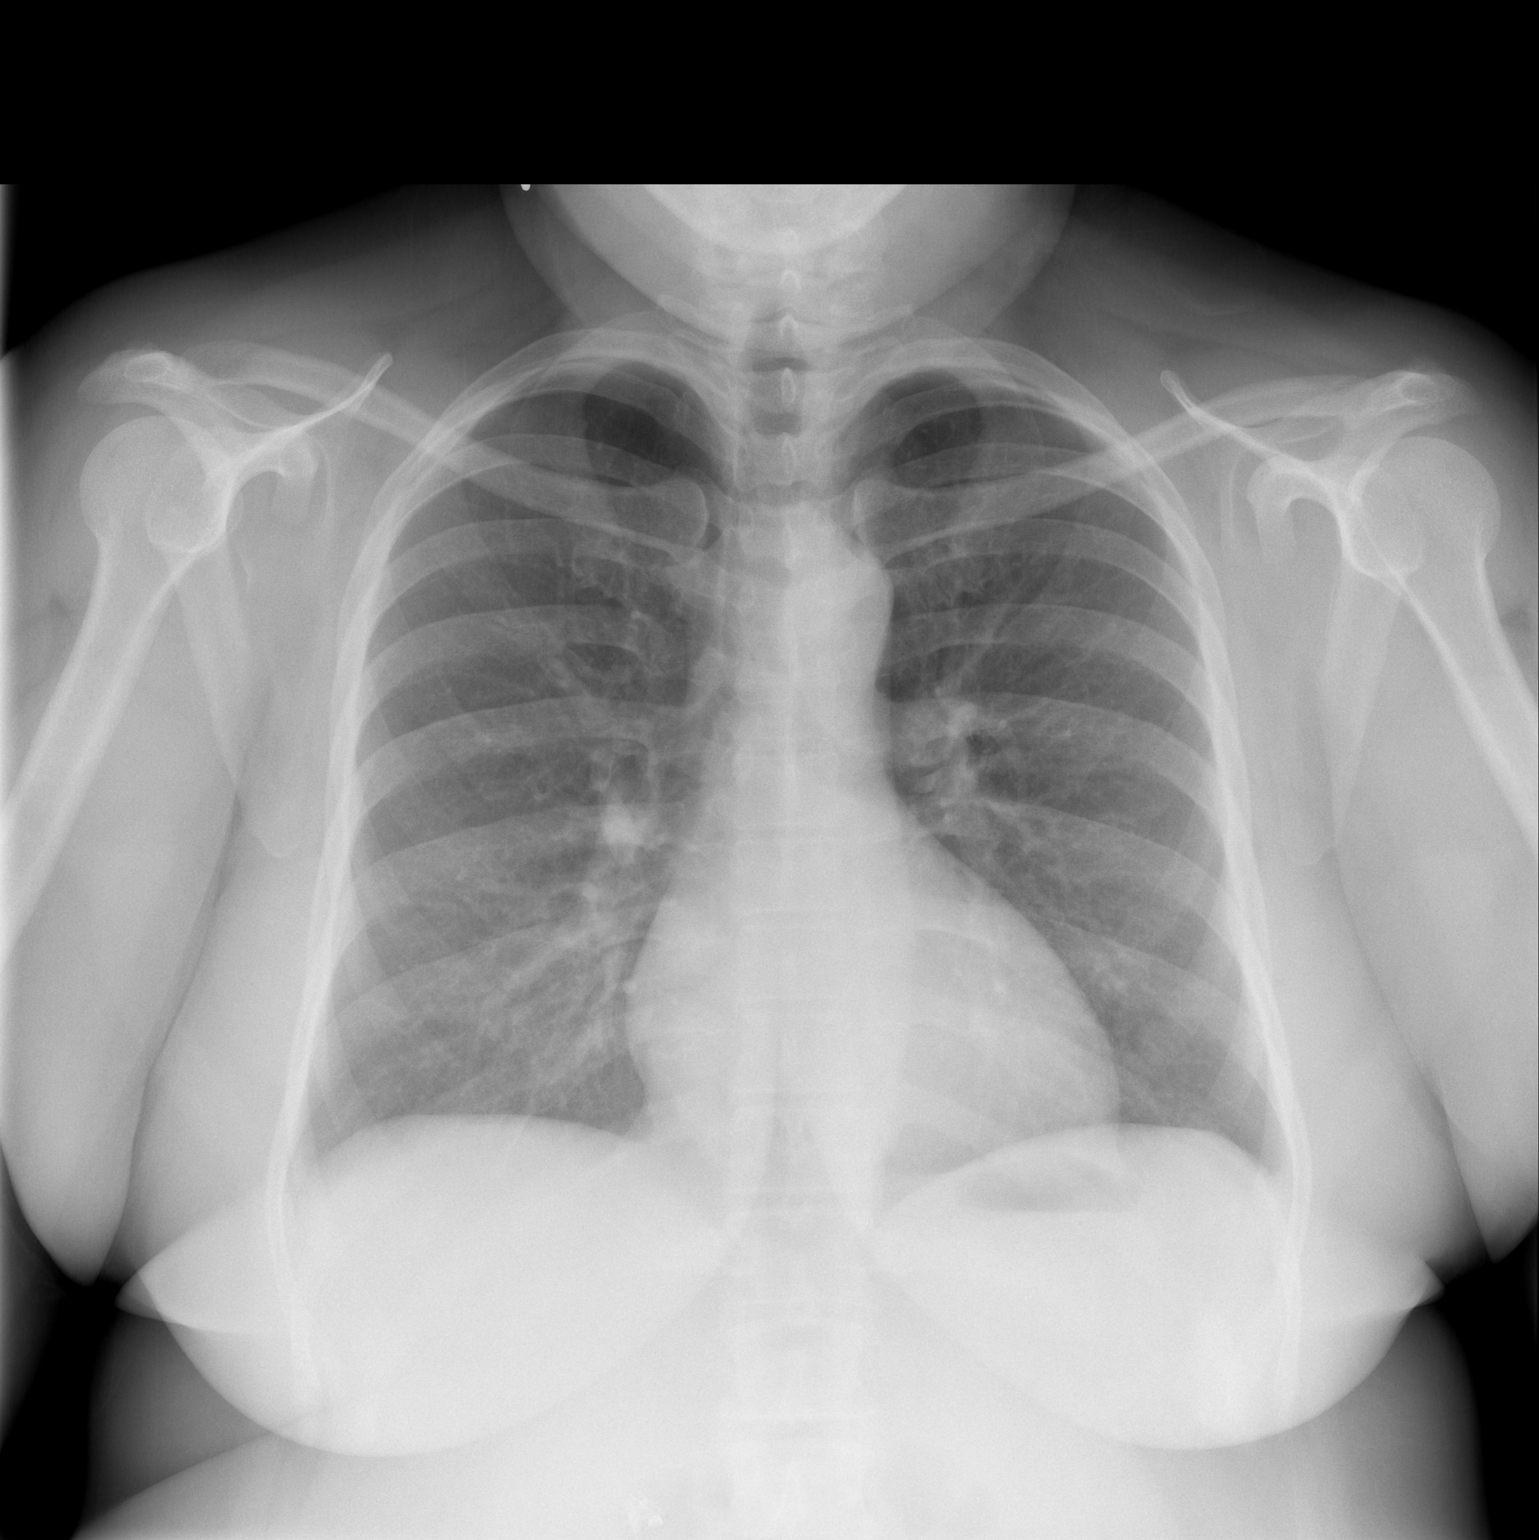

[w chest lat]
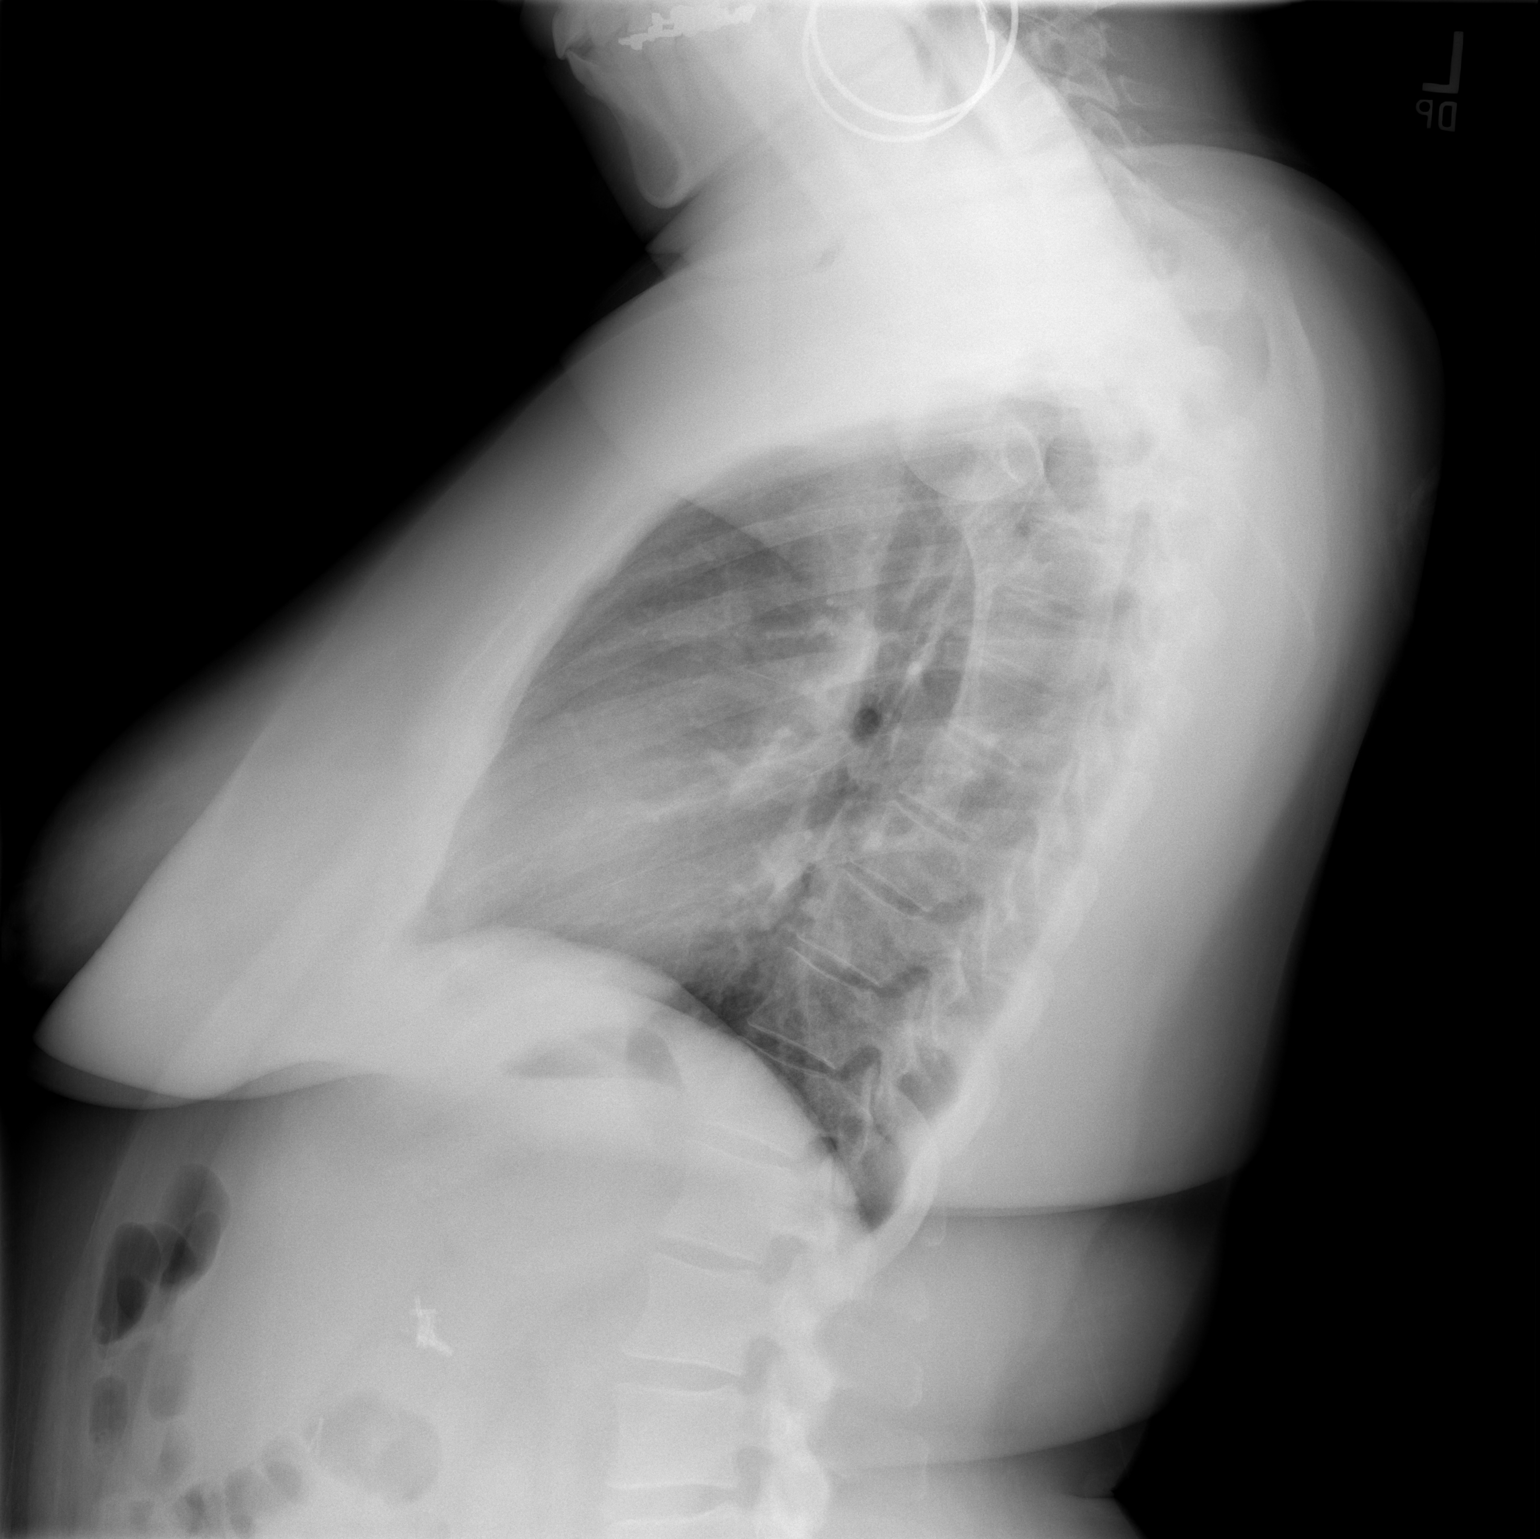

[2 of 2 positions shown; findings below may reference images not displayed]

FINDINGS: Previous right basilar atelectasis has resolved.The
cardiomediastinal contours are normal. The lungs are clear.
Pulmonary vasculature is normal. No consolidation, pleural effusion,
or pneumothorax. No acute osseous abnormalities are seen.
IMPRESSION: Negative radiographs of the chest.

## 2021-07-19 ENCOUNTER — Inpatient Hospital Stay: Payer: No Typology Code available for payment source

## 2021-07-19 ENCOUNTER — Other Ambulatory Visit: Payer: Self-pay

## 2021-07-19 ENCOUNTER — Inpatient Hospital Stay: Payer: No Typology Code available for payment source | Attending: Hematology | Admitting: Hematology

## 2021-07-19 VITALS — BP 134/78 | HR 95 | Temp 97.9°F | Resp 17 | Ht 62.0 in | Wt 191.5 lb

## 2021-07-19 DIAGNOSIS — Z79899 Other long term (current) drug therapy: Secondary | ICD-10-CM | POA: Diagnosis not present

## 2021-07-19 DIAGNOSIS — D75839 Thrombocytosis, unspecified: Secondary | ICD-10-CM | POA: Diagnosis not present

## 2021-07-19 DIAGNOSIS — Z6835 Body mass index (BMI) 35.0-35.9, adult: Secondary | ICD-10-CM | POA: Diagnosis not present

## 2021-07-19 DIAGNOSIS — D72820 Lymphocytosis (symptomatic): Secondary | ICD-10-CM

## 2021-07-19 DIAGNOSIS — E669 Obesity, unspecified: Secondary | ICD-10-CM | POA: Diagnosis not present

## 2021-07-19 LAB — CMP (CANCER CENTER ONLY)
ALT: 7 U/L (ref 0–44)
AST: 10 U/L — ABNORMAL LOW (ref 15–41)
Albumin: 4.2 g/dL (ref 3.5–5.0)
Alkaline Phosphatase: 102 U/L (ref 38–126)
Anion gap: 6 (ref 5–15)
BUN: 12 mg/dL (ref 6–20)
CO2: 28 mmol/L (ref 22–32)
Calcium: 9.5 mg/dL (ref 8.9–10.3)
Chloride: 104 mmol/L (ref 98–111)
Creatinine: 0.72 mg/dL (ref 0.44–1.00)
GFR, Estimated: >60 mL/min (ref >60)
Glucose, Bld: 97 mg/dL (ref 70–99)
Potassium: 3.9 mmol/L (ref 3.5–5.1)
Sodium: 138 mmol/L (ref 135–145)
Total Bilirubin: 0.5 mg/dL (ref 0.3–1.2)
Total Protein: 8 g/dL (ref 6.5–8.1)

## 2021-07-19 LAB — CBC WITH DIFFERENTIAL/PLATELET
Abs Immature Granulocytes: 0.02 10*3/uL (ref 0.00–0.07)
Basophils Absolute: 0 10*3/uL (ref 0.0–0.1)
Basophils Relative: 0 %
Eosinophils Absolute: 0.2 10*3/uL (ref 0.0–0.5)
Eosinophils Relative: 3 %
HCT: 36 % (ref 36.0–46.0)
Hemoglobin: 11.7 g/dL — ABNORMAL LOW (ref 12.0–15.0)
Immature Granulocytes: 0 %
Lymphocytes Relative: 33 %
Lymphs Abs: 2.9 10*3/uL (ref 0.7–4.0)
MCH: 25.9 pg — ABNORMAL LOW (ref 26.0–34.0)
MCHC: 32.5 g/dL (ref 30.0–36.0)
MCV: 79.6 fL — ABNORMAL LOW (ref 80.0–100.0)
Monocytes Absolute: 0.7 10*3/uL (ref 0.1–1.0)
Monocytes Relative: 8 %
Neutro Abs: 4.9 10*3/uL (ref 1.7–7.7)
Neutrophils Relative %: 56 %
Platelets: 503 10*3/uL — ABNORMAL HIGH (ref 150–400)
RBC: 4.52 MIL/uL (ref 3.87–5.11)
RDW: 15.3 % (ref 11.5–15.5)
WBC: 8.8 10*3/uL (ref 4.0–10.5)
nRBC: 0 % (ref 0.0–0.2)

## 2021-07-19 LAB — IRON AND IRON BINDING CAPACITY (CC-WL,HP ONLY)
Iron: 37 ug/dL (ref 28–170)
Saturation Ratios: 9 % — ABNORMAL LOW (ref 10.4–31.8)
TIBC: 395 ug/dL (ref 250–450)
UIBC: 358 ug/dL (ref 148–442)

## 2021-07-19 LAB — FERRITIN: Ferritin: 57 ng/mL (ref 11–307)

## 2021-07-19 LAB — SURGICAL PATHOLOGY

## 2021-07-19 LAB — LACTATE DEHYDROGENASE: LDH: 138 U/L (ref 98–192)

## 2021-07-19 LAB — SEDIMENTATION RATE: Sed Rate: 27 mm/hr — ABNORMAL HIGH (ref 0–22)

## 2021-07-19 NOTE — Progress Notes (Signed)
? ? ?HEMATOLOGY/ONCOLOGY CLINIC NOTE ? ?Date of Service: 07/19/2021 ? ?Patient Care Team: ?Scifres, [name], PA-C (Inactive) as PCP - General (Librarian, academic) ? ?CHIEF COMPLAINTS/PURPOSE OF CONSULTATION:  ? ?Reconsulted for worsening thrombocytosis ? ? ?HISTORY OF PRESENTING ILLNESS: see previous note by Dr Walden Field ? ?INTERVAL HISTORY ?Teresa Webster has been referred back to [place] by her primary care physician for worsened thrombocytosis. ?She was last seen in clinic by [place] in September 2021 at which time her thrombocytosis had spontaneously completely resolved. ?Patient on recent labs with her primary care physician on 06/30/2021 had a CBC which showed a hemoglobin of 11.4, MCV of 83, WBC count of 8.7k with lymphocyte count of 3.6k and platelets of 620k. ?Patient notes no issues with heavy periods at this time. ?Notes no new focal symptoms. ?No constitutional symptoms such as fevers chills, night sweats, unexpected weight loss new lumps or bumps, skin rashes, joint pain or swelling. ?No cigarette or marijuana smoking.  No other drug use. ?No other focal symptoms suggestive of inflammation. ?.Body mass index is 35.03 kg/m?. ?Since her last visit she also has had cosmetic surgery with the resident but left and liposuction in March 2022 which could potentially serve as a source of inflammation. ? ?MEDICAL HISTORY:  ?Obesity .Body mass index is 35.03 kg/m?. ? ? ?SURGICAL HISTORY: ?Cholecystectomy 2010 ?Bilateral tubal ligation's 2007 ?Childbirth x3 ?Brazilian butt lift and liposuction 05/2020. ? ?SOCIAL HISTORY: ?Social History  ? ?Socioeconomic History  ? Marital status: Married  ?  Spouse name: Not on file  ? Number of children: Not on file  ? Years of education: Not on file  ? Highest education level: Not on file  ?Occupational History  ? Not on file  ?Tobacco Use  ? Smoking status: Never  ? Smokeless tobacco: Never  ?Vaping Use  ? Vaping Use: Never used  ?Substance and Sexual Activity  ? Alcohol use: Yes  ? Drug use:  No  ? Sexual activity: Not on file  ?Other Topics Concern  ? Not on file  ?Social History Narrative  ? Not on file  ? ?Social Determinants of Health  ? ?Financial Resource Strain: Not on file  ?Food Insecurity: Not on file  ?Transportation Needs: Not on file  ?Physical Activity: Not on file  ?Stress: Not on file  ?Social Connections: Not on file  ?Intimate Partner Violence: Not on file  ? ? ?FAMILY HISTORY: ?Family History  ?Problem Relation Age of Onset  ? Healthy Mother   ? ? ?ALLERGIES:  has No Known Allergies. ? ?MEDICATIONS:  ?#1 hydrochlorothiazide 25 mg p.o. daily ? ?REVIEW OF SYSTEMS:   ?10 Point review of Systems was done is negative except as noted above. ? ?PHYSICAL EXAMINATION: ?ECOG PERFORMANCE STATUS: 0 - Asymptomatic ? ?. ?Vitals:  ? 07/19/21 0908  ?BP: 134/78  ?Pulse: 95  ?Resp: 17  ?Temp: 97.9 ?F (36.6 ?C)  ?SpO2: 100%  ? ?Filed Weights  ? 07/19/21 0908  ?Weight: 191 lb 8 oz (86.9 kg)  ? ?.Body mass index is 35.03 kg/m?. ?. ?GENERAL:alert, in no acute distress and comfortable ?SKIN: no acute rashes, no significant lesions ?EYES: conjunctiva are pink and non-injected, sclera anicteric ?OROPHARYNX: MMM, no exudates, no oropharyngeal erythema or ulceration ?NECK: supple, no JVD ?LYMPH:  no palpable lymphadenopathy in the cervical, axillary or inguinal regions ?LUNGS: clear to auscultation b/l with normal respiratory effort ?HEART: regular rate & rhythm ?ABDOMEN:  normoactive bowel sounds , non tender, not distended.  No palpable hepatosplenomegaly ?Extremity: no pedal edema ?  PSYCH: alert & oriented x 3 with fluent speech ?NEURO: no focal motor/sensory deficits ? ? ?LABORATORY DATA:  ?I have reviewed the data as listed ? ?. ? ?  Latest Ref Rng & Units 11/29/2019  ? 12:01 PM 10/26/2018  ? 12:52 PM 04/09/2018  ?  8:19 AM  ?CBC  ?WBC 4.0 - 10.5 K/uL 6.7   8.1   8.7    ?Hemoglobin 12.0 - 15.0 g/dL 11.9   12.0   11.6    ?Hematocrit 36.0 - 46.0 % 36.5   37.4   35.0    ?Platelets 150 - 400 K/uL 358   477   424     ? ? ?. ? ?  Latest Ref Rng & Units 10/26/2018  ? 12:52 PM 04/09/2018  ?  8:19 AM 03/20/2007  ?  6:00 AM  ?CMP  ?Glucose 70 - 99 mg/dL 99   87   93    ?BUN 6 - 20 mg/dL '9   12   18    '$ ?Creatinine 0.44 - 1.00 mg/dL 0.78   0.73   0.75    ?Sodium 135 - 145 mmol/L 137   137   141    ?Potassium 3.5 - 5.1 mmol/L 3.3   4.0   4.3    ?Chloride 98 - 111 mmol/L 104   107   110    ?CO2 22 - 32 mmol/L '24   24   28    '$ ?Calcium 8.9 - 10.3 mg/dL 9.2   9.0   9.7    ?Total Protein 6.5 - 8.1 g/dL 7.4   7.4   7.0    ?Total Bilirubin 0.3 - 1.2 mg/dL 0.4   0.4   0.5    ?Alkaline Phos 38 - 126 U/L 98   102   64    ?AST 15 - 41 U/L '21   17   17    '$ ?ALT 0 - 44 U/L '26   19   16    '$ ? ?. ?Lab Results  ?Component Value Date  ? IRON 33 (L) 04/09/2018  ? TIBC 381 04/09/2018  ? IRONPCTSAT 9 (L) 04/09/2018  ? ?(Iron and TIBC) ? ?Lab Results  ?Component Value Date  ? FERRITIN 88 10/26/2018  ? ? ?BCR-ABL1 FISH ?Order: [id number] ?Status:  Final result   Visible to patient:  Yes (MyChart) Next appt:  11/01/2019 at 09:30 AM in Oncology [facility] Lab 2) Dx:  Thrombocytosis ([place]); Microcytosis ?Component 55moago  ?Specimen Type BLOOD   ?Cells Counted 200   ?Cells Analyzed 200   ?FISH Result Comment:   ?Comment: NORMAL:  NO BCR OR ABL1 GENE REARRANGEMENT OBSERVED  ?  ?  ? ?CALR Mutation Detection Result Comment   ?Comment: (NOTE)  ?NEGATIVE   ? ?JAK2 Exons 12-15 Mut Det PCR: Comment   ?Comment: (NOTE)  ?NEGATIVE   ? ?JAK2 GenotypR Comment   ?Comment: (NOTE)  ?Result: NEGATIVE for the JAK2 V617F mutation.   ? ? ? ?RADIOGRAPHIC STUDIES: ?I have personally reviewed the radiological images as listed and agreed with the findings in the report. ?No results found. ? ? ?ASSESSMENT & PLAN:  ? ?1.  Progressive thrombocytosis with platelets of 620k ?Prior testing had ruled out clonal mutations and her platelet counts have normalized in September 2021. ? ?2.  Atypical lymphocytes with mild lymphocytosis. ?PLAN: ?-I discussed available lab results with the patient from  her labs done with the PCP in April 2023. ?-We discussed and updated her HPI,  past medical and surgical histories and interval history. ?-Her recent platelets have significantly increased to 620k ?-We will rule out reactive etiologies including iron deficiency, any signs of inflammation with a sedimentation rate. ?-Patient currently does not does not have any symptoms or signs of an autoimmune condition. ?-We will check peripheral blood flow cytometry to characterize her atypical lymphocytes and rule out a clonal lymphoproliferative process. ?-We will repeat her clonal platelet studies to rule out a myeloproliferative process. ?-Patient has had some cosmetic surgery including liposuction and butt lift in March 2022 and if no other clonal process or iron deficiency is found it is likely that this could be a potential source of inflammation. ? ?FOLLOW UP: ?Labs today ?Phone visit with Dr. Irene Limbo in 2 weeks ? ?The total time spent in the appointment was 20 minutes*. ? ?All of the patient's questions were answered with apparent satisfaction. The patient knows to call the clinic with any problems, questions or concerns. ? ? ?Sullivan Lone MD MS AAHIVMS [facility] ?Hematology/Oncology Physician ?[place] ? ?.*Total Encounter Time as defined by the Centers for Medicare and Medicaid Services includes, in addition to the face-to-face time of a patient visit (documented in the note above) non-face-to-face time: obtaining and reviewing outside history, ordering and reviewing medications, tests or procedures, care coordination (communications with other health care professionals or caregivers) and documentation in the medical record. ? ? ?

## 2021-07-22 LAB — FLOW CYTOMETRY

## 2021-07-26 LAB — JAK2 (INCLUDING V617F AND EXON 12), MPL,& CALR-NEXT GEN SEQ

## 2021-08-02 ENCOUNTER — Inpatient Hospital Stay: Payer: No Typology Code available for payment source | Admitting: Hematology

## 2021-08-02 NOTE — Progress Notes (Incomplete)
HEMATOLOGY/ONCOLOGY CLINIC NOTE  Date of Service: 08/02/2021  Patient Care Team: Scifres, [name], PA-C (Inactive) as PCP - General (Physician Assistant)  CHIEF COMPLAINTS/PURPOSE OF CONSULTATION:  Continued consultation and evaluation of worsening thrombocytosis   HISTORY OF PRESENTING ILLNESS: Please see previous note for details on initial presentation  INTERVAL HISTORY I connected with Teresa Webster on 08/02/2021 at 8:40 AM EST by telephone visit and verified that I am speaking with the correct person using two identifiers.   I discussed the limitations, risks, security and privacy concerns of performing an evaluation and management service by telemedicine and the availability of in-person appointments. I also discussed with the patient that there may be a patient responsible charge related to this service. The patient expressed understanding and agreed to proceed.   Other persons participating in the visit and their role in the encounter: None  Patient's location: Home Provider's location: [place]  I connected with Teresa Webster who is a 46 y.o. female via phone to discuss her worsened thrombocytosis following a referral from her PCP. She was last seen in clinic by Teresa Webster in 07/19/2021. She reports She is doing well with no new symptoms or concerns.  ***  Patient notes no issues with heavy periods at this time.  No other focal symptoms suggestive of inflammation.  .There is no height or weight on file to calculate BMI.  Since her last visit she also has had cosmetic surgery with the resident but left and liposuction in March 2022 which could potentially serve as a source of inflammation. ***  We reviewed labs done 07/19/2021 in detail. CBC showed platelets improved at 503k. Otherwise stable. CMP WNL except decreased AST at 10 U/L. Iron sat ratio is 9%. LDH at 138 U/L. Ferritin at 57. JAK2 results reviewed in detail. Sed rate is elevated  slightly at 27 mm/hr. -Flow symmetry done 07/19/2021 revealed "No monoclonal B-cell, phenotypically aberrant T-cell, or distinct  blast population identified "  MEDICAL HISTORY:  Obesity .There is no height or weight on file to calculate BMI.   SURGICAL HISTORY: Cholecystectomy 2010 Bilateral tubal ligation's 2007 Childbirth x3 Brazilian butt lift and liposuction 05/2020.  SOCIAL HISTORY: Social History   Socioeconomic History   Marital status: Married    Spouse name: Not on file   Number of children: Not on file   Years of education: Not on file   Highest education level: Not on file  Occupational History   Not on file  Tobacco Use   Smoking status: Never   Smokeless tobacco: Never  Vaping Use   Vaping Use: Never used  Substance and Sexual Activity   Alcohol use: Yes   Drug use: No   Sexual activity: Not on file  Other Topics Concern   Not on file  Social History Narrative   Not on file   Social Determinants of Health   Financial Resource Strain: Not on file  Food Insecurity: Not on file  Transportation Needs: Not on file  Physical Activity: Not on file  Stress: Not on file  Social Connections: Not on file  Intimate Partner Violence: Not on file    FAMILY HISTORY: Family History  Problem Relation Age of Onset   Healthy Mother     ALLERGIES:  has No Known Allergies.  MEDICATIONS:  #1 hydrochlorothiazide 25 mg p.o. daily  REVIEW OF SYSTEMS:   10 Point review of Systems was done is negative except as noted above.  PHYSICAL EXAMINATION: Telemedicine appointment  LABORATORY DATA:  I have reviewed the data as listed  .    Latest Ref Rng & Units 07/19/2021    9:49 AM 11/29/2019   12:01 PM 10/26/2018   12:52 PM  CBC  WBC 4.0 - 10.5 K/uL 8.8   6.7   8.1    Hemoglobin 12.0 - 15.0 g/dL 11.7   11.9   12.0    Hematocrit 36.0 - 46.0 % 36.0   36.5   37.4    Platelets 150 - 400 K/uL 503   358   477      .    Latest Ref Rng & Units 07/19/2021    9:49 AM  10/26/2018   12:52 PM 04/09/2018    8:19 AM  CMP  Glucose 70 - 99 mg/dL 97   99   87    BUN 6 - 20 mg/dL '12   9   12    '$ Creatinine 0.44 - 1.00 mg/dL 0.72   0.78   0.73    Sodium 135 - 145 mmol/L 138   137   137    Potassium 3.5 - 5.1 mmol/L 3.9   3.3   4.0    Chloride 98 - 111 mmol/L 104   104   107    CO2 22 - 32 mmol/L '28   24   24    '$ Calcium 8.9 - 10.3 mg/dL 9.5   9.2   9.0    Total Protein 6.5 - 8.1 g/dL 8.0   7.4   7.4    Total Bilirubin 0.3 - 1.2 mg/dL 0.5   0.4   0.4    Alkaline Phos 38 - 126 U/L 102   98   102    AST 15 - 41 U/L '10   21   17    '$ ALT 0 - 44 U/L '7   26   19     '$ . Lab Results  Component Value Date   IRON 37 07/19/2021   TIBC 395 07/19/2021   IRONPCTSAT 9 (L) 07/19/2021   (Iron and TIBC)  Lab Results  Component Value Date   FERRITIN 57 07/19/2021    BCR-ABL1 FISH Order: [id number] Status:  Final result   Visible to patient:  Yes (MyChart) Next appt:  11/01/2019 at 09:30 AM in Oncology [facility] Lab 2) Dx:  Thrombocytosis ([place]); Microcytosis Component 34moago  Specimen Type BLOOD   Cells Counted 200   Cells Analyzed 200   FISH Result Comment:   Comment: NORMAL:  NO BCR OR ABL1 GENE REARRANGEMENT OBSERVED       CALR Mutation Detection Result Comment   Comment: (NOTE)  NEGATIVE    JAK2 Exons 12-15 Mut Det PCR: Comment   Comment: (NOTE)  NEGATIVE    JAK2 GenotypR Comment   Comment: (NOTE)  Result: NEGATIVE for the JAK2 V617F mutation.    JAK2 done 07/19/2021 revealed    RADIOGRAPHIC STUDIES: I have personally reviewed the radiological images as listed and agreed with the findings in the report. No results found.   ASSESSMENT & PLAN:   1.  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  2.  Atypical lymphocytes with mild lymphocytosis.  PLAN: -We reviewed labs done 07/19/2021 in detail. CBC showed platelets improved at 503k.  Otherwise stable. CMP WNL except decreased AST at 10 U/L. Iron sat ratio is 9%. LDH at 138 U/L. Ferritin at 57.  JAK2 results reviewed in detail. Sed rate is elevated slightly at 27 mm/hr. -Flow symmetry done 07/19/2021 revealed "No monoclonal B-cell, phenotypically aberrant T-cell, or distinct  blast population identified " -We discussed and updated her HPI, past medical and surgical histories and interval history.*** -Her recent platelets have significantly increased to 620k -Patient currently does not does not have any symptoms or signs of an autoimmune condition. -Patient has had some cosmetic surgery including liposuction and butt lift in March 2022 and if no other clonal process or iron deficiency is found it is likely that this could be a potential source of inflammation.***  FOLLOW UP: ***  The total time spent in the appointment was *** minutes*.  All of the patient's questions were answered with apparent satisfaction. The patient knows to call the clinic with any problems, questions or concerns.   Sullivan Lone MD MS AAHIVMS [facility] Hematology/Oncology Physician [facility]  .*Total Encounter Time as defined by the Centers for Medicare and Medicaid Services includes, in addition to the face-to-face time of a patient visit (documented in the note above) non-face-to-face time: obtaining and reviewing outside history, ordering and reviewing medications, tests or procedures, care coordination (communications with other health care professionals or caregivers) and documentation in the medical record.  I, Melene Muller, am acting as scribe for Dr. Sullivan Lone, MD.

## 2021-08-17 ENCOUNTER — Inpatient Hospital Stay: Payer: No Typology Code available for payment source | Attending: Hematology | Admitting: Hematology

## 2021-08-17 DIAGNOSIS — R700 Elevated erythrocyte sedimentation rate: Secondary | ICD-10-CM | POA: Insufficient documentation

## 2021-08-17 DIAGNOSIS — E669 Obesity, unspecified: Secondary | ICD-10-CM | POA: Insufficient documentation

## 2021-08-17 DIAGNOSIS — Z79899 Other long term (current) drug therapy: Secondary | ICD-10-CM | POA: Insufficient documentation

## 2021-08-17 DIAGNOSIS — D75839 Thrombocytosis, unspecified: Secondary | ICD-10-CM | POA: Insufficient documentation

## 2021-08-17 DIAGNOSIS — D72820 Lymphocytosis (symptomatic): Secondary | ICD-10-CM | POA: Insufficient documentation

## 2021-08-17 MED ORDER — POLYSACCHARIDE IRON COMPLEX 150 MG PO CAPS: 150.0000 mg | ORAL_CAPSULE | Freq: Every day | ORAL | 2 refills | Status: AC

## 2021-08-24 NOTE — Progress Notes (Signed)
HEMATOLOGY/ONCOLOGY PHONE VISIT NOTE  Date of Service: .08/17/2021   Patient Care Team: Scifres, [name], PA-C (Inactive) as PCP - General (Physician Assistant)  CHIEF COMPLAINTS/PURPOSE OF CONSULTATION:  Continued consultation and evaluation of worsening thrombocytosis   HISTORY OF PRESENTING ILLNESS: Please see previous note for details on initial presentation  INTERVAL HISTORY  ..I connected with Teresa Webster on 08/17/2021 at  3:30 PM EDT by telephone visit and verified that I am speaking with the correct person using two identifiers.   I discussed the limitations, risks, security and privacy concerns of performing an evaluation and management service by telemedicine and the availability of in-person appointments. I also discussed with the patient that there may be a patient responsible charge related to this service. The patient expressed understanding and agreed to proceed.   Other persons participating in the visit and their role in the encounter: none   Patient's location: Home  Provider's location: [facility]   Chief Complaint: discussion of lab results   We reviewed labs done 07/19/2021 in detail. CBC showed platelets improved at 503k. Otherwise stable. CMP WNL except decreased AST at 10 U/L. Iron sat ratio is 9%. LDH at 138 U/L. Ferritin at 57. JAK2 results reviewed in detail. Sed rate is elevated slightly at 27 mm/hr. -Flow symmetry done 07/19/2021 revealed "No monoclonal B-cell, phenotypically aberrant T-cell, or distinct  blast population identified "  MEDICAL HISTORY:  Obesity .There is no height or weight on file to calculate BMI.   SURGICAL HISTORY: Cholecystectomy 2010 Bilateral tubal ligation's 2007 Childbirth x3 Brazilian butt lift and liposuction 05/2020.  SOCIAL HISTORY: Social History   Socioeconomic History   Marital status: Married    Spouse name: Not on file   Number of children: Not on file   Years of education: Not on file   Highest  education level: Not on file  Occupational History   Not on file  Tobacco Use   Smoking status: Never   Smokeless tobacco: Never  Vaping Use   Vaping Use: Never used  Substance and Sexual Activity   Alcohol use: Yes   Drug use: No   Sexual activity: Not on file  Other Topics Concern   Not on file  Social History Narrative   Not on file   Social Determinants of Health   Financial Resource Strain: Not on file  Food Insecurity: Not on file  Transportation Needs: Not on file  Physical Activity: Not on file  Stress: Not on file  Social Connections: Not on file  Intimate Partner Violence: Not on file    FAMILY HISTORY: Family History  Problem Relation Age of Onset   Healthy Mother     ALLERGIES:  has No Known Allergies.  MEDICATIONS:  #1 hydrochlorothiazide 25 mg p.o. daily  REVIEW OF SYSTEMS:   10 Point review of Systems was done is negative except as noted above.  PHYSICAL EXAMINATION: Telemedicine appointment  LABORATORY DATA:  I have reviewed the data as listed  .    Latest Ref Rng & Units 07/19/2021    9:49 AM 11/29/2019   12:01 PM 10/26/2018   12:52 PM  CBC  WBC 4.0 - 10.5 K/uL 8.8  6.7  8.1   Hemoglobin 12.0 - 15.0 g/dL 11.7  11.9  12.0   Hematocrit 36.0 - 46.0 % 36.0  36.5  37.4   Platelets 150 - 400 K/uL 503  358  477     .    Latest Ref Rng & Units 07/19/2021  9:49 AM 10/26/2018   12:52 PM 04/09/2018    8:19 AM  CMP  Glucose 70 - 99 mg/dL 97  99  87   BUN 6 - 20 mg/dL '12  9  12   '$ Creatinine 0.44 - 1.00 mg/dL 0.72  0.78  0.73   Sodium 135 - 145 mmol/L 138  137  137   Potassium 3.5 - 5.1 mmol/L 3.9  3.3  4.0   Chloride 98 - 111 mmol/L 104  104  107   CO2 22 - 32 mmol/L '28  24  24   '$ Calcium 8.9 - 10.3 mg/dL 9.5  9.2  9.0   Total Protein 6.5 - 8.1 g/dL 8.0  7.4  7.4   Total Bilirubin 0.3 - 1.2 mg/dL 0.5  0.4  0.4   Alkaline Phos 38 - 126 U/L 102  98  102   AST 15 - 41 U/L '10  21  17   '$ ALT 0 - 44 U/L '7  26  19    '$ . Lab Results  Component  Value Date   IRON 37 07/19/2021   TIBC 395 07/19/2021   IRONPCTSAT 9 (L) 07/19/2021   (Iron and TIBC)  Lab Results  Component Value Date   FERRITIN 57 07/19/2021    BCR-ABL1 FISH Order: [id number] Status:  Final result   Visible to patient:  Yes (MyChart) Next appt:  11/01/2019 at 09:30 AM in On[facility]) Dx:  Thrombocytosis ([place]); Microcytosis Component 62moago  Specimen Type BLOOD   Cells Counted 200   Cells Analyzed 200   FISH Result Comment:   Comment: NORMAL:  NO BCR OR ABL1 GENE REARRANGEMENT OBSERVED       CALR Mutation Detection Result Comment   Comment: (NOTE)  NEGATIVE    JAK2 Exons 12-15 Mut Det PCR: Comment   Comment: (NOTE)  NEGATIVE    JAK2 GenotypR Comment   Comment: (NOTE)  Result: NEGATIVE for the JAK2 [id number] mutation.    JAK2 done 07/19/2021 revealed    RADIOGRAPHIC STUDIES: I have personally reviewed the radiological images as listed and agreed with the findings in the report. No results found.   ASSESSMENT & PLAN:   1.  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  2.  Atypical lymphocytes with mild lymphocytosis.  PLAN: -We reviewed labs done 07/19/2021 in detail. CBC showed platelets improved at 503k. Otherwise stable. CMP WNL except decreased AST at 10 U/L. Iron sat ratio is 9%. LDH at 138 U/L. Ferritin at 57. JAK2 results reviewed in detail.  No evidence of clonal erythrocytosis.  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 -Flow symmetry done 07/19/2021 revealed "No monoclonal B-cell, phenotypically aberrant T-cell, or distinct  blast population identified " No evidence for primary bone marrow disorder at this time. FOLLOW UP: Return to clinic with PCP..Marland Kitchen The total time spent in the appointment was  20 minutes*.  All of the patient's questions were answered with apparent satisfaction. The patient knows to call the clinic with any problems, questions or concerns.   GSullivan LoneMD MS AAHIVMS SSt Elizabeth Physicians Endoscopy CenterCVa Medical Center - Castle Point CampusHematology/Oncology Physician CSouth[facility] LLC .*Total Encounter Time as defined by the Centers for Medicare and Medicaid Services includes, in addition to the face-to-face time of a patient visit (documented in the note above)  non-face-to-face time: obtaining and reviewing outside history, ordering and reviewing medications, tests or procedures, care coordination (communications with other health care professionals or caregivers) and documentation in the medical record.

## 2021-08-26 NOTE — Progress Notes (Signed)
This encounter was created in error - please disregard.

## 2021-12-29 IMAGING — CR DG CHEST 2V
2 series · 2 of 2 positions shown · non-contrast
Comparison: 11/07/2019

CLINICAL DATA: Preop liposuction.

EXAM:
CHEST - 2 VIEW

[w chest pa]
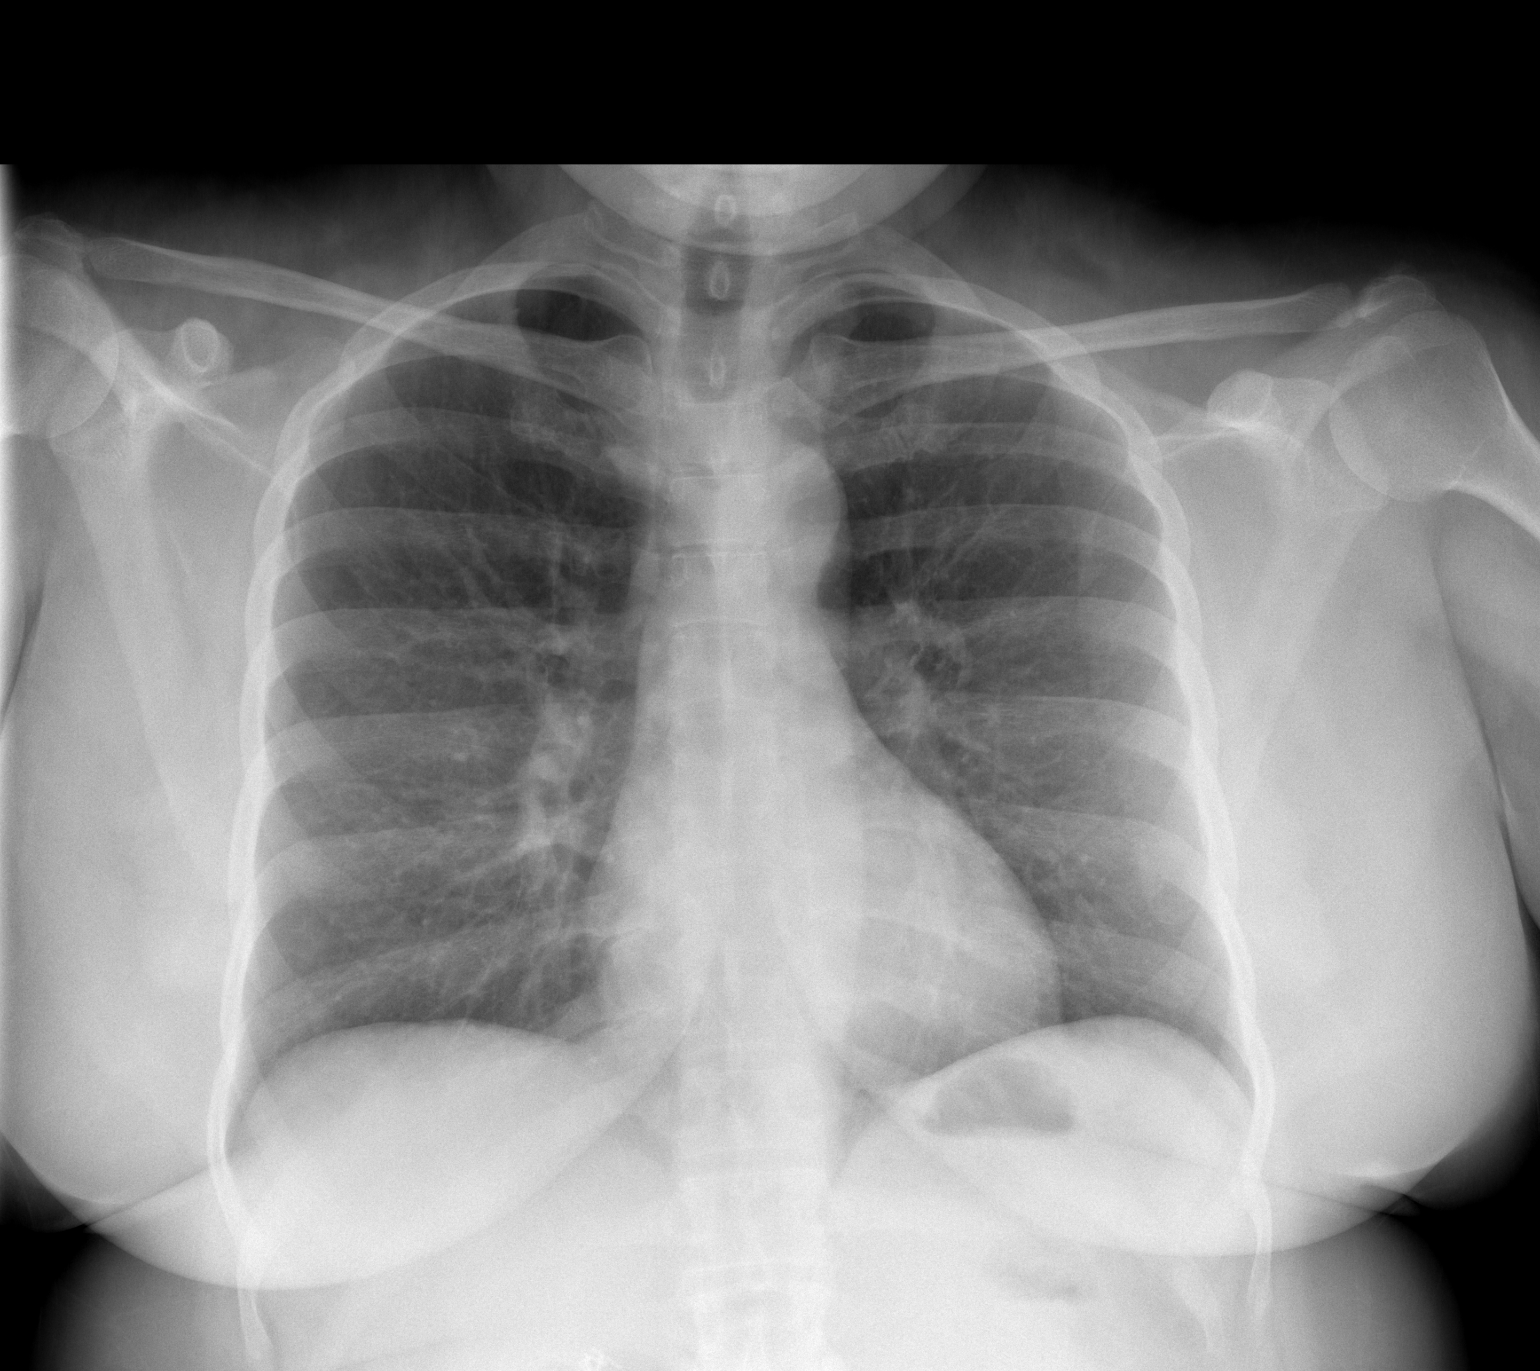

[w chest lat]
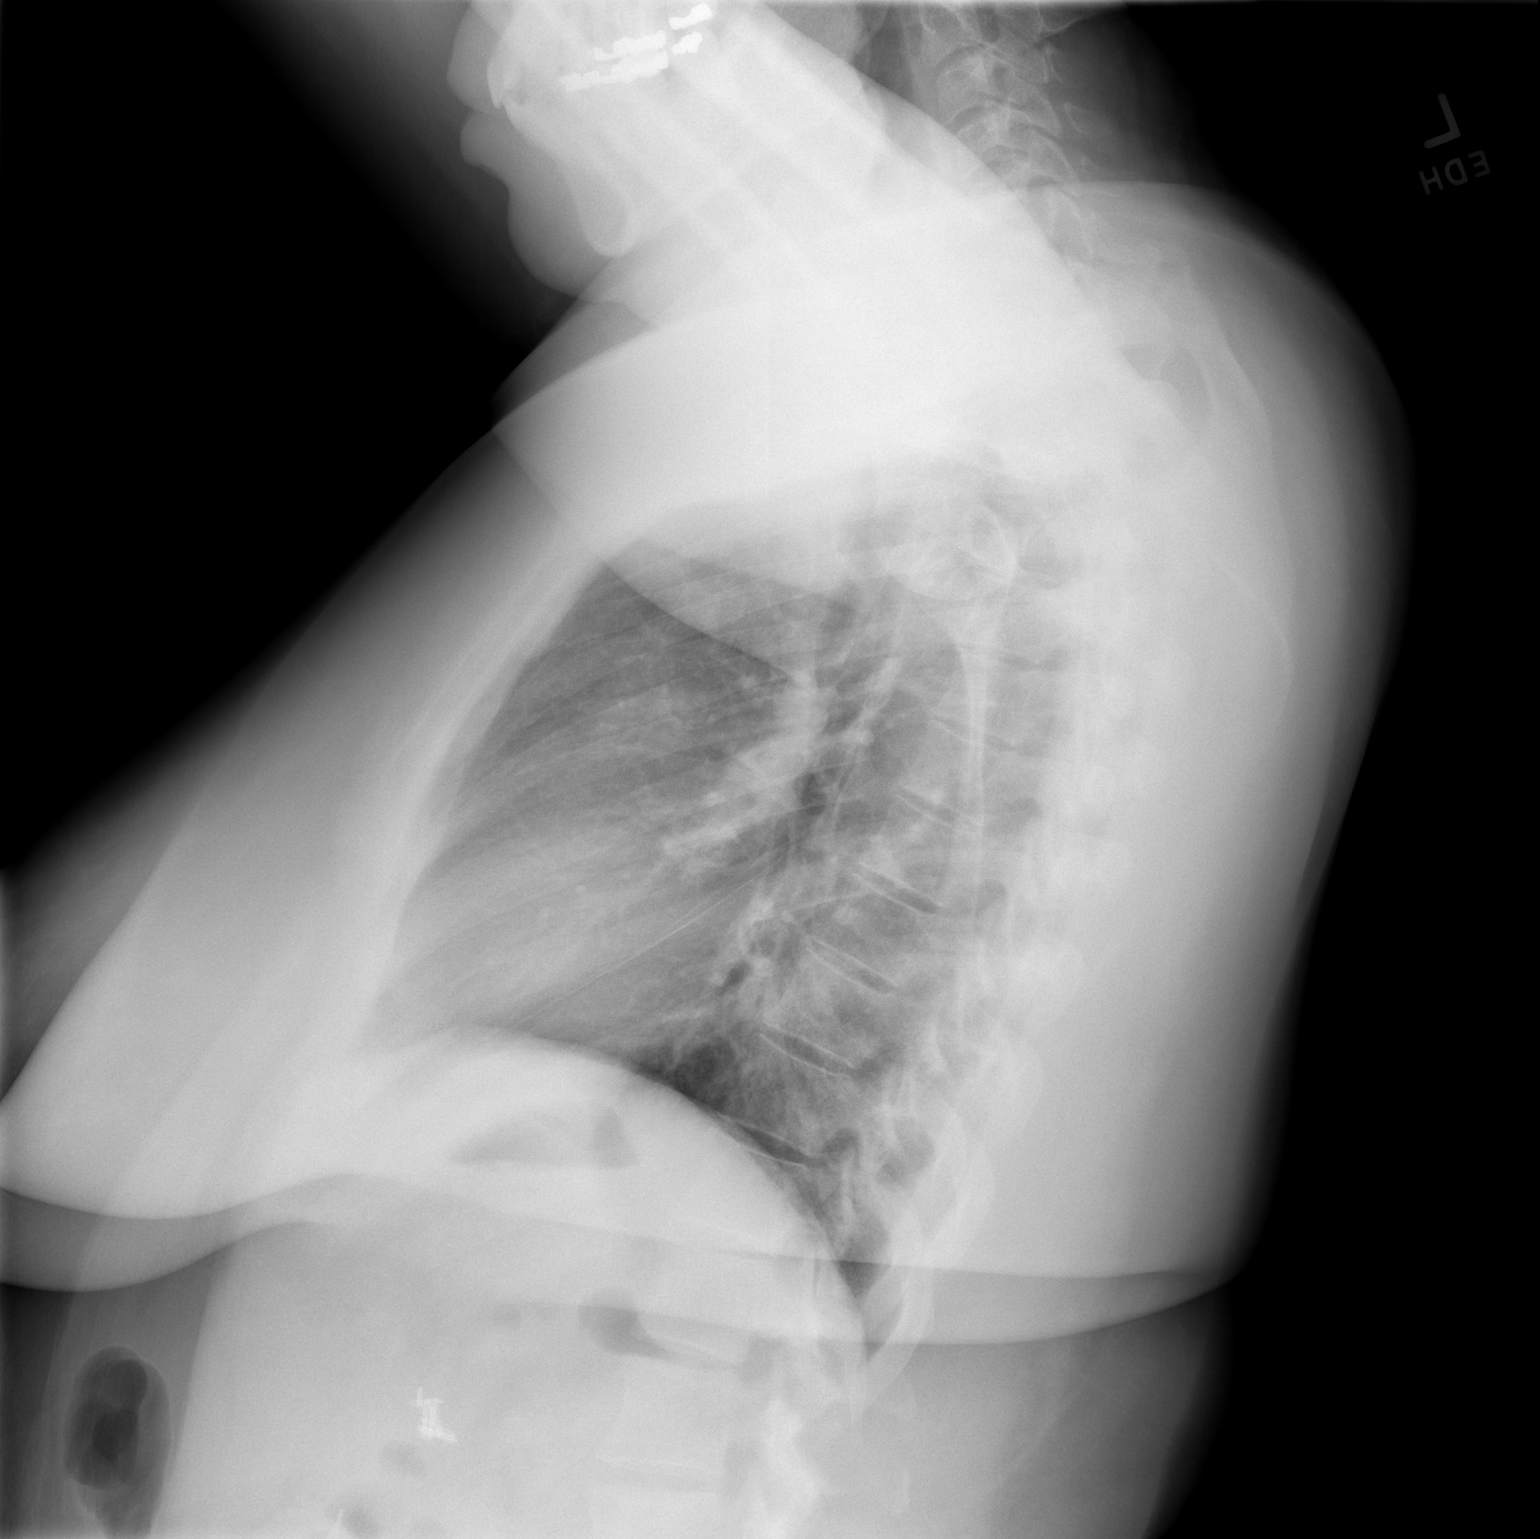

[2 of 2 positions shown; findings below may reference images not displayed]

FINDINGS: The cardiomediastinal contours are normal. The lungs are clear.
Pulmonary vasculature is normal. No consolidation, pleural effusion,
or pneumothorax. No acute osseous abnormalities are seen.
IMPRESSION: Negative radiographs of the chest.
